# Patient Record
Sex: Female | Born: 1950 | Race: White | Hispanic: No | Smoking: Never smoker
Health system: Southern US, Community
[De-identification: ages and names within clinical notes are randomized; demographics above are authoritative.]

## PROBLEM LIST (undated history)

## (undated) DIAGNOSIS — F32A Depression, unspecified: Secondary | ICD-10-CM

## (undated) DIAGNOSIS — F329 Major depressive disorder, single episode, unspecified: Secondary | ICD-10-CM

## (undated) DIAGNOSIS — Z9889 Other specified postprocedural states: Secondary | ICD-10-CM

## (undated) DIAGNOSIS — I10 Essential (primary) hypertension: Secondary | ICD-10-CM

## (undated) DIAGNOSIS — D171 Benign lipomatous neoplasm of skin and subcutaneous tissue of trunk: Secondary | ICD-10-CM

## (undated) DIAGNOSIS — F419 Anxiety disorder, unspecified: Secondary | ICD-10-CM

## (undated) DIAGNOSIS — R112 Nausea with vomiting, unspecified: Secondary | ICD-10-CM

## (undated) DIAGNOSIS — I4891 Unspecified atrial fibrillation: Secondary | ICD-10-CM

## (undated) DIAGNOSIS — Z8709 Personal history of other diseases of the respiratory system: Secondary | ICD-10-CM

## (undated) DIAGNOSIS — N393 Stress incontinence (female) (male): Secondary | ICD-10-CM

## (undated) DIAGNOSIS — J189 Pneumonia, unspecified organism: Secondary | ICD-10-CM

## (undated) DIAGNOSIS — K219 Gastro-esophageal reflux disease without esophagitis: Secondary | ICD-10-CM

## (undated) DIAGNOSIS — M1990 Unspecified osteoarthritis, unspecified site: Secondary | ICD-10-CM

## (undated) HISTORY — DX: Essential (primary) hypertension: I10

## (undated) HISTORY — PX: APPENDECTOMY: SHX54

## (undated) HISTORY — DX: Pneumonia, unspecified organism: J18.9

## (undated) HISTORY — DX: Unspecified atrial fibrillation: I48.91

## (undated) HISTORY — PX: TONSILLECTOMY: SUR1361

## (undated) HISTORY — DX: Gastro-esophageal reflux disease without esophagitis: K21.9

---

## 1998-07-07 ENCOUNTER — Other Ambulatory Visit: Admission: RE | Admit: 1998-07-07 | Discharge: 1998-07-07 | Payer: Self-pay | Admitting: Obstetrics and Gynecology

## 2000-08-08 ENCOUNTER — Other Ambulatory Visit: Admission: RE | Admit: 2000-08-08 | Discharge: 2000-08-08 | Payer: Self-pay | Admitting: Obstetrics and Gynecology

## 2002-07-26 ENCOUNTER — Other Ambulatory Visit: Admission: RE | Admit: 2002-07-26 | Discharge: 2002-07-26 | Payer: Self-pay | Admitting: Obstetrics and Gynecology

## 2002-12-24 ENCOUNTER — Encounter: Admission: RE | Admit: 2002-12-24 | Discharge: 2002-12-24 | Payer: Self-pay | Admitting: Family Medicine

## 2002-12-24 ENCOUNTER — Encounter: Payer: Self-pay | Admitting: Family Medicine

## 2003-03-02 ENCOUNTER — Ambulatory Visit (HOSPITAL_COMMUNITY): Admission: RE | Admit: 2003-03-02 | Discharge: 2003-03-02 | Payer: Self-pay | Admitting: Family Medicine

## 2003-03-02 ENCOUNTER — Inpatient Hospital Stay (HOSPITAL_COMMUNITY): Admission: EM | Admit: 2003-03-02 | Discharge: 2003-03-09 | Payer: Self-pay | Admitting: Emergency Medicine

## 2003-10-22 ENCOUNTER — Other Ambulatory Visit: Admission: RE | Admit: 2003-10-22 | Discharge: 2003-10-22 | Payer: Self-pay | Admitting: Obstetrics and Gynecology

## 2005-05-25 ENCOUNTER — Ambulatory Visit: Payer: Self-pay | Admitting: Internal Medicine

## 2005-06-05 ENCOUNTER — Ambulatory Visit: Payer: Self-pay | Admitting: Internal Medicine

## 2005-06-21 ENCOUNTER — Ambulatory Visit: Payer: Self-pay | Admitting: Internal Medicine

## 2005-09-07 ENCOUNTER — Ambulatory Visit: Payer: Self-pay | Admitting: Internal Medicine

## 2005-10-07 ENCOUNTER — Ambulatory Visit: Payer: Self-pay | Admitting: Internal Medicine

## 2005-11-03 ENCOUNTER — Ambulatory Visit (HOSPITAL_COMMUNITY): Admission: RE | Admit: 2005-11-03 | Discharge: 2005-11-03 | Payer: Self-pay | Admitting: Chiropractic Medicine

## 2006-08-07 ENCOUNTER — Encounter: Admission: RE | Admit: 2006-08-07 | Discharge: 2006-08-07 | Payer: Self-pay | Admitting: Orthopaedic Surgery

## 2007-04-13 HISTORY — PX: KNEE ARTHROPLASTY: SHX992

## 2008-11-10 LAB — HM COLONOSCOPY: HM Colonoscopy: 2

## 2012-09-28 ENCOUNTER — Ambulatory Visit (INDEPENDENT_AMBULATORY_CARE_PROVIDER_SITE_OTHER): Payer: BC Managed Care – PPO | Admitting: Surgery

## 2012-10-10 DIAGNOSIS — D171 Benign lipomatous neoplasm of skin and subcutaneous tissue of trunk: Secondary | ICD-10-CM

## 2012-10-10 HISTORY — DX: Benign lipomatous neoplasm of skin and subcutaneous tissue of trunk: D17.1

## 2012-10-20 ENCOUNTER — Ambulatory Visit (INDEPENDENT_AMBULATORY_CARE_PROVIDER_SITE_OTHER): Payer: BC Managed Care – PPO | Admitting: Surgery

## 2012-10-20 ENCOUNTER — Encounter (INDEPENDENT_AMBULATORY_CARE_PROVIDER_SITE_OTHER): Payer: Self-pay | Admitting: Surgery

## 2012-10-20 VITALS — BP 136/84 | HR 80 | Temp 98.1°F | Resp 18 | Ht 62.0 in | Wt 221.8 lb

## 2012-10-20 DIAGNOSIS — D171 Benign lipomatous neoplasm of skin and subcutaneous tissue of trunk: Secondary | ICD-10-CM | POA: Insufficient documentation

## 2012-10-20 DIAGNOSIS — D1779 Benign lipomatous neoplasm of other sites: Secondary | ICD-10-CM

## 2012-10-20 NOTE — Patient Instructions (Addendum)

## 2012-10-20 NOTE — Progress Notes (Signed)
Chief Complaint:  Mass on the right back posteriorally  History of Present Illness:  Stacey Caldwell is an 62 y.o. female has an itching place her back for which see was seen at Dr. Nino Parsley office and found to have a mass over her scapula on the right.      Past Medical History  Diagnosis Date  . Arthritis   . GERD (gastroesophageal reflux disease)   . Hypertension   . Osteoporosis     Past Surgical History  Procedure Laterality Date  . Appendectomy    . Tonsillectomy and adenoidectomy    . Knee arthroplasty      Current Outpatient Prescriptions  Medication Sig Dispense Refill  . ALPRAZolam (XANAX) 0.5 MG tablet       . buPROPion (WELLBUTRIN XL) 150 MG 24 hr tablet       . omeprazole (PRILOSEC) 40 MG capsule       . valsartan-hydrochlorothiazide (DIOVAN-HCT) 320-12.5 MG per tablet       . venlafaxine XR (EFFEXOR-XR) 150 MG 24 hr capsule        No current facility-administered medications for this visit.   Codeine Family History  Problem Relation Age of Onset  . Cancer Mother     lung  . Cancer Father     lung   Social History:   reports that she has never smoked. She does not have any smokeless tobacco history on file. She reports that  drinks alcohol. She reports that she does not use illicit drugs.   REVIEW OF SYSTEMS - PERTINENT POSITIVES ONLY: noncontributory  Physical Exam:   Blood pressure 136/84, pulse 80, temperature 98.1 F (36.7 C), temperature source Temporal, resp. rate 18, height 5\' 2"  (1.575 m), weight 221 lb 12.8 oz (100.608 kg). Body mass index is 40.56 kg/(m^2).  Gen:  WDWN WF NAD  Neurological: Alert and oriented to person, place, and time. Motor and sensory function is grossly intact  Head: Normocephalic and atraumatic.  Eyes: Conjunctivae are normal. Pupils are equal, round, and reactive to light. No scleral icterus.  Neck: Normal range of motion. Neck supple. No tracheal deviation or thyromegaly present.   Skin: Skin is warm and dry. No  rash noted. No diaphoresis. No erythema. No pallor. Pscyh: Normal mood and affect. Behavior is normal. Judgment and thought content normal.   LABORATORY RESULTS: No results found for this or any previous visit (from the past 48 hour(s)).  RADIOLOGY RESULTS: No results found.  Problem List: There are no active problems to display for this patient.   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 Daphine Deutscher, MD, [facility] [phone] beeper [phone]  10/20/2012 2:19 PM

## 2012-11-06 ENCOUNTER — Encounter (HOSPITAL_BASED_OUTPATIENT_CLINIC_OR_DEPARTMENT_OTHER): Payer: Self-pay | Admitting: *Deleted

## 2012-11-06 NOTE — Pre-Procedure Instructions (Signed)
To come for BMET and EKG; no EKG at Dr. Tawana Scale office.

## 2012-11-10 ENCOUNTER — Other Ambulatory Visit: Payer: Self-pay

## 2012-11-10 ENCOUNTER — Encounter (HOSPITAL_BASED_OUTPATIENT_CLINIC_OR_DEPARTMENT_OTHER)
Admission: RE | Admit: 2012-11-10 | Discharge: 2012-11-10 | Disposition: A | Discharge status: Home / Self Care | Payer: BC Managed Care – PPO | Source: Ambulatory Visit | Attending: Surgery | Admitting: Surgery

## 2012-11-10 LAB — BASIC METABOLIC PANEL
BUN: 14 mg/dL (ref 6–23)
CO2: 27 mEq/L (ref 19–32)
Calcium: 9.1 mg/dL (ref 8.4–10.5)
Chloride: 100 mEq/L (ref 96–112)
Creatinine, Ser: 0.84 mg/dL (ref 0.50–1.10)
GFR calc Af Amer: 84 mL/min — ABNORMAL LOW (ref >90)
GFR calc non Af Amer: 72 mL/min — ABNORMAL LOW (ref >90)
Glucose, Bld: 113 mg/dL — ABNORMAL HIGH (ref 70–99)
Potassium: 4 mEq/L (ref 3.5–5.1)
Sodium: 137 mEq/L (ref 135–145)

## 2012-11-13 ENCOUNTER — Ambulatory Visit (HOSPITAL_BASED_OUTPATIENT_CLINIC_OR_DEPARTMENT_OTHER)
Admission: RE | Admit: 2012-11-13 | Discharge: 2012-11-13 | Disposition: A | Discharge status: Home / Self Care | Payer: BC Managed Care – PPO | Source: Ambulatory Visit | Attending: Surgery | Admitting: Surgery

## 2012-11-13 ENCOUNTER — Encounter (HOSPITAL_BASED_OUTPATIENT_CLINIC_OR_DEPARTMENT_OTHER)
Admission: RE | Disposition: A | Discharge status: Home / Self Care | Payer: Self-pay | Source: Ambulatory Visit | Attending: Surgery

## 2012-11-13 ENCOUNTER — Encounter (HOSPITAL_BASED_OUTPATIENT_CLINIC_OR_DEPARTMENT_OTHER): Payer: Self-pay | Admitting: Anesthesiology

## 2012-11-13 ENCOUNTER — Ambulatory Visit (HOSPITAL_BASED_OUTPATIENT_CLINIC_OR_DEPARTMENT_OTHER): Payer: BC Managed Care – PPO | Admitting: Anesthesiology

## 2012-11-13 DIAGNOSIS — M810 Age-related osteoporosis without current pathological fracture: Secondary | ICD-10-CM | POA: Insufficient documentation

## 2012-11-13 DIAGNOSIS — D1739 Benign lipomatous neoplasm of skin and subcutaneous tissue of other sites: Secondary | ICD-10-CM | POA: Insufficient documentation

## 2012-11-13 DIAGNOSIS — K219 Gastro-esophageal reflux disease without esophagitis: Secondary | ICD-10-CM | POA: Insufficient documentation

## 2012-11-13 DIAGNOSIS — Z79899 Other long term (current) drug therapy: Secondary | ICD-10-CM | POA: Insufficient documentation

## 2012-11-13 DIAGNOSIS — M129 Arthropathy, unspecified: Secondary | ICD-10-CM | POA: Insufficient documentation

## 2012-11-13 DIAGNOSIS — I10 Essential (primary) hypertension: Secondary | ICD-10-CM | POA: Insufficient documentation

## 2012-11-13 HISTORY — DX: Depression, unspecified: F32.A

## 2012-11-13 HISTORY — PX: LIPOMA EXCISION: SHX5283

## 2012-11-13 HISTORY — DX: Other specified postprocedural states: Z98.890

## 2012-11-13 HISTORY — DX: Benign lipomatous neoplasm of skin and subcutaneous tissue of trunk: D17.1

## 2012-11-13 HISTORY — DX: Anxiety disorder, unspecified: F41.9

## 2012-11-13 HISTORY — DX: Unspecified osteoarthritis, unspecified site: M19.90

## 2012-11-13 HISTORY — DX: Stress incontinence (female) (male): N39.3

## 2012-11-13 HISTORY — DX: Major depressive disorder, single episode, unspecified: F32.9

## 2012-11-13 HISTORY — DX: Personal history of other diseases of the respiratory system: Z87.09

## 2012-11-13 HISTORY — DX: Nausea with vomiting, unspecified: R11.2

## 2012-11-13 LAB — POCT HEMOGLOBIN-HEMACUE: Hemoglobin: 13.8 g/dL (ref 12.0–15.0)

## 2012-11-13 SURGERY — EXCISION LIPOMA
Anesthesia: Monitor Anesthesia Care | Site: Back | Laterality: Right | Wound class: Clean

## 2012-11-13 MED ORDER — MIDAZOLAM HCL 5 MG/5ML IJ SOLN
INTRAMUSCULAR | Status: DC | PRN
  Administered 2012-11-13 (×2): 1 mg via INTRAVENOUS

## 2012-11-13 MED ORDER — ONDANSETRON HCL 4 MG/2ML IJ SOLN: 4.0000 mg | Freq: Once | INTRAMUSCULAR | Status: DC | PRN

## 2012-11-13 MED ORDER — FENTANYL CITRATE 0.05 MG/ML IJ SOLN
INTRAMUSCULAR | Status: DC | PRN
  Administered 2012-11-13: 25 ug via INTRAVENOUS
  Administered 2012-11-13: 50 ug via INTRAVENOUS

## 2012-11-13 MED ORDER — ONDANSETRON HCL 4 MG/2ML IJ SOLN
INTRAMUSCULAR | Status: DC | PRN
  Administered 2012-11-13: 4 mg via INTRAVENOUS

## 2012-11-13 MED ORDER — HYDROMORPHONE HCL PF 1 MG/ML IJ SOLN: 0.2500 mg | INTRAMUSCULAR | Status: DC | PRN

## 2012-11-13 MED ORDER — LIDOCAINE HCL (CARDIAC) 20 MG/ML IV SOLN
INTRAVENOUS | Status: DC | PRN
  Administered 2012-11-13: 50 mg via INTRAVENOUS

## 2012-11-13 MED ORDER — LACTATED RINGERS IV SOLN
INTRAVENOUS | Status: DC
  Administered 2012-11-13: 14:00:00 via INTRAVENOUS

## 2012-11-13 MED ORDER — LIDOCAINE HCL (PF) 1 % IJ SOLN
INTRAMUSCULAR | Status: DC | PRN
  Administered 2012-11-13: 6 mL

## 2012-11-13 MED ORDER — PROPOFOL INFUSION 10 MG/ML OPTIME
INTRAVENOUS | Status: DC | PRN
  Administered 2012-11-13: 100 ug/kg/min via INTRAVENOUS

## 2012-11-13 MED ORDER — CHLORHEXIDINE GLUCONATE 4 % EX LIQD: 1.0000 "application " | Freq: Once | CUTANEOUS | Status: DC

## 2012-11-13 MED ORDER — HEPARIN SODIUM (PORCINE) 5000 UNIT/ML IJ SOLN
5000.0000 [IU] | Freq: Once | INTRAMUSCULAR | Status: AC
  Administered 2012-11-13: 5000 [IU] via SUBCUTANEOUS

## 2012-11-13 MED ORDER — SCOPOLAMINE 1 MG/3DAYS TD PT72
1.0000 | MEDICATED_PATCH | TRANSDERMAL | Status: DC
  Administered 2012-11-13: 1.5 mg via TRANSDERMAL

## 2012-11-13 MED ORDER — OXYCODONE HCL 5 MG/5ML PO SOLN: 5.0000 mg | Freq: Once | ORAL | Status: DC | PRN

## 2012-11-13 MED ORDER — OXYCODONE-ACETAMINOPHEN 5-325 MG PO TABS: 1.0000 | ORAL_TABLET | ORAL | Status: DC | PRN

## 2012-11-13 MED ORDER — OXYCODONE HCL 5 MG PO TABS: 5.0000 mg | ORAL_TABLET | Freq: Once | ORAL | Status: DC | PRN

## 2012-11-13 MED ORDER — CEFAZOLIN SODIUM-DEXTROSE 2-3 GM-% IV SOLR
2.0000 g | INTRAVENOUS | Status: AC
  Administered 2012-11-13: 2 g via INTRAVENOUS

## 2012-11-13 MED ORDER — FENTANYL CITRATE 0.05 MG/ML IJ SOLN: 50.0000 ug | INTRAMUSCULAR | Status: DC | PRN

## 2012-11-13 MED ORDER — MIDAZOLAM HCL 2 MG/ML PO SYRP: 12.0000 mg | ORAL_SOLUTION | Freq: Once | ORAL | Status: DC | PRN

## 2012-11-13 MED ORDER — MIDAZOLAM HCL 2 MG/2ML IJ SOLN: 1.0000 mg | INTRAMUSCULAR | Status: DC | PRN

## 2012-11-13 MED ORDER — DEXAMETHASONE SODIUM PHOSPHATE 4 MG/ML IJ SOLN
INTRAMUSCULAR | Status: DC | PRN
  Administered 2012-11-13: 10 mg via INTRAVENOUS

## 2012-11-13 SURGICAL SUPPLY — 47 items
ADH SKN CLS APL DERMABOND .7 (GAUZE/BANDAGES/DRESSINGS)
APL SKNCLS STERI-STRIP NONHPOA (GAUZE/BANDAGES/DRESSINGS)
BENZOIN TINCTURE PRP APPL 2/3 (GAUZE/BANDAGES/DRESSINGS) IMPLANT
BLADE SURG 15 STRL LF DISP TIS (BLADE) ×1 IMPLANT
BLADE SURG 15 STRL SS (BLADE) ×2
BLADE SURG ROTATE 9660 (MISCELLANEOUS) IMPLANT
CANISTER SUCTION 1200CC (MISCELLANEOUS) ×1 IMPLANT
CLEANER CAUTERY TIP 5X5 PAD (MISCELLANEOUS) ×1 IMPLANT
CLOTH BEACON ORANGE TIMEOUT ST (SAFETY) ×2 IMPLANT
COVER MAYO STAND STRL (DRAPES) ×2 IMPLANT
COVER TABLE BACK 60X90 (DRAPES) ×2 IMPLANT
DECANTER SPIKE VIAL GLASS SM (MISCELLANEOUS) ×2 IMPLANT
DERMABOND ADVANCED (GAUZE/BANDAGES/DRESSINGS)
DERMABOND ADVANCED .7 DNX12 (GAUZE/BANDAGES/DRESSINGS) IMPLANT
DRAPE PED LAPAROTOMY (DRAPES) ×1 IMPLANT
DRAPE U-SHAPE 76X120 STRL (DRAPES) IMPLANT
ELECT REM PT RETURN 9FT ADLT (ELECTROSURGICAL) ×2
ELECTRODE REM PT RTRN 9FT ADLT (ELECTROSURGICAL) ×1 IMPLANT
GAUZE SPONGE 4X4 12PLY STRL LF (GAUZE/BANDAGES/DRESSINGS) ×2 IMPLANT
GLOVE BIO SURGEON STRL SZ8 (GLOVE) ×2 IMPLANT
GLOVE BIOGEL M STRL SZ7.5 (GLOVE) ×1 IMPLANT
GLOVE BIOGEL PI IND STRL 8 (GLOVE) IMPLANT
GLOVE BIOGEL PI INDICATOR 8 (GLOVE) ×1
GLOVE EXAM NITRILE MD LF STRL (GLOVE) ×2 IMPLANT
GOWN PREVENTION PLUS XLARGE (GOWN DISPOSABLE) ×1 IMPLANT
GOWN PREVENTION PLUS XXLARGE (GOWN DISPOSABLE) ×3 IMPLANT
NDL HYPO 25X1 1.5 SAFETY (NEEDLE) ×1 IMPLANT
NEEDLE 27GAX1X1/2 (NEEDLE) IMPLANT
NEEDLE HYPO 25X1 1.5 SAFETY (NEEDLE) ×2 IMPLANT
NS IRRIG 1000ML POUR BTL (IV SOLUTION) IMPLANT
PACK BASIN DAY SURGERY FS (CUSTOM PROCEDURE TRAY) ×2 IMPLANT
PAD CLEANER CAUTERY TIP 5X5 (MISCELLANEOUS) ×1
PENCIL BUTTON HOLSTER BLD 10FT (ELECTRODE) ×2 IMPLANT
STRIP CLOSURE SKIN 1/2X4 (GAUZE/BANDAGES/DRESSINGS) IMPLANT
SUT ETHILON 3 0 FSL (SUTURE) IMPLANT
SUT ETHILON 5 0 PS 2 18 (SUTURE) IMPLANT
SUT PROLENE 4 0 PS 2 18 (SUTURE) ×1 IMPLANT
SUT VIC AB 4-0 SH 18 (SUTURE) ×2 IMPLANT
SUT VIC AB 5-0 PS2 18 (SUTURE) IMPLANT
SUT VICRYL 3-0 CR8 SH (SUTURE) IMPLANT
SYR BULB 3OZ (MISCELLANEOUS) ×2 IMPLANT
SYR CONTROL 10ML LL (SYRINGE) ×2 IMPLANT
TOWEL OR 17X24 6PK STRL BLUE (TOWEL DISPOSABLE) ×2 IMPLANT
TRAY DSU PREP LF (CUSTOM PROCEDURE TRAY) ×2 IMPLANT
TUBE CONNECTING 20X1/4 (TUBING) ×1 IMPLANT
UNDERPAD 30X30 INCONTINENT (UNDERPADS AND DIAPERS) IMPLANT
YANKAUER SUCT BULB TIP NO VENT (SUCTIONS) ×2 IMPLANT

## 2012-11-13 NOTE — Anesthesia Preprocedure Evaluation (Signed)
Anesthesia Evaluation  Patient identified by MRN, date of birth, ID band Patient awake    Reviewed: Allergy & Precautions, H&P , NPO status , Patient's Chart, lab work & pertinent test results  History of Anesthesia Complications (+) PONV  Airway Mallampati: I TM Distance: >3 FB Neck ROM: Full    Dental  (+) Teeth Intact and Dental Advisory Given   Pulmonary  breath sounds clear to auscultation        Cardiovascular hypertension, Pt. on medications Rhythm:Regular Rate:Normal     Neuro/Psych    GI/Hepatic   Endo/Other    Renal/GU      Musculoskeletal   Abdominal   Peds  Hematology   Anesthesia Other Findings   Reproductive/Obstetrics                           Anesthesia Physical Anesthesia Plan  ASA: II  Anesthesia Plan: MAC   Post-op Pain Management:    Induction: Intravenous  Airway Management Planned: Simple Face Mask  Additional Equipment:   Intra-op Plan:   Post-operative Plan:   Informed Consent: I have reviewed the patients History and Physical, chart, labs and discussed the procedure including the risks, benefits and alternatives for the proposed anesthesia with the patient or authorized representative who has indicated his/her understanding and acceptance.   Dental advisory given  Plan Discussed with: CRNA, Anesthesiologist and Surgeon  Anesthesia Plan Comments:         Anesthesia Quick Evaluation

## 2012-11-13 NOTE — Transfer of Care (Signed)
Immediate Anesthesia Transfer of Care Note  Patient: Stacey Caldwell  Procedure(s) Performed: Procedure(s): EXCISION LIPOMA (Right)  Patient Location: PACU  Anesthesia Type:MAC  Level of Consciousness: awake and alert   Airway & Oxygen Therapy: Patient Spontanous Breathing and Patient connected to face mask oxygen  Post-op Assessment: Report given to PACU RN and Post -op Vital signs reviewed and stable  Post vital signs: Reviewed and stable  Complications: No apparent anesthesia complications

## 2012-11-13 NOTE — Interval H&P Note (Signed)
History and Physical Interval Note:  11/13/2012 3:03 PM  Stacey Caldwell  has presented today for surgery, with the diagnosis of lipoma back  The various methods of treatment have been discussed with the patient and family. After consideration of risks, benefits and other options for treatment, the patient has consented to  Procedure(s): EXCISION LIPOMA (N/A) as a surgical intervention .  The patient's history has been reviewed, patient examined, no change in status, stable for surgery.  I have reviewed the patient's chart and labs.  Questions were answered to the patient's satisfaction.     Shakeeta Godette B

## 2012-11-13 NOTE — Anesthesia Postprocedure Evaluation (Signed)
  Anesthesia Post-op Note  Patient: Stacey Caldwell  Procedure(s) Performed: Procedure(s): EXCISION LIPOMA (Right)  Patient Location: PACU  Anesthesia Type:MAC  Level of Consciousness: awake, alert  and oriented  Airway and Oxygen Therapy: Patient Spontanous Breathing  Post-op Pain: none  Post-op Assessment: Post-op Vital signs reviewed  Post-op Vital Signs: Reviewed  Complications: No apparent anesthesia complications

## 2012-11-13 NOTE — Progress Notes (Signed)
Dr.Crews reviewed EKG - OK for surgery. 

## 2012-11-13 NOTE — H&P (View-Only) (Signed)
Chief Complaint:  Mass on the right back posteriorally  History of Present Illness:  Stacey Caldwell is an 61 y.o. female has an itching place her back for which see was seen at Dr. Gruber's office and found to have a mass over her scapula on the right.      Past Medical History  Diagnosis Date  . Arthritis   . GERD (gastroesophageal reflux disease)   . Hypertension   . Osteoporosis     Past Surgical History  Procedure Laterality Date  . Appendectomy    . Tonsillectomy and adenoidectomy    . Knee arthroplasty      Current Outpatient Prescriptions  Medication Sig Dispense Refill  . ALPRAZolam (XANAX) 0.5 MG tablet       . buPROPion (WELLBUTRIN XL) 150 MG 24 hr tablet       . omeprazole (PRILOSEC) 40 MG capsule       . valsartan-hydrochlorothiazide (DIOVAN-HCT) 320-12.5 MG per tablet       . venlafaxine XR (EFFEXOR-XR) 150 MG 24 hr capsule        No current facility-administered medications for this visit.   Codeine Family History  Problem Relation Age of Onset  . Cancer Mother     lung  . Cancer Father     lung   Social History:   reports that she has never smoked. She does not have any smokeless tobacco history on file. She reports that  drinks alcohol. She reports that she does not use illicit drugs.   REVIEW OF SYSTEMS - PERTINENT POSITIVES ONLY: noncontributory  Physical Exam:   Blood pressure 136/84, pulse 80, temperature 98.1 F (36.7 C), temperature source Temporal, resp. rate 18, height 5' 2" (1.575 m), weight 221 lb 12.8 oz (100.608 kg). Body mass index is 40.56 kg/(m^2).  Gen:  WDWN WF NAD  Neurological: Alert and oriented to person, place, and time. Motor and sensory function is grossly intact  Head: Normocephalic and atraumatic.  Eyes: Conjunctivae are normal. Pupils are equal, round, and reactive to light. No scleral icterus.  Neck: Normal range of motion. Neck supple. No tracheal deviation or thyromegaly present.   Skin: Skin is warm and dry. No  rash noted. No diaphoresis. No erythema. No pallor. Pscyh: Normal mood and affect. Behavior is normal. Judgment and thought content normal.   LABORATORY RESULTS: No results found for this or any previous visit (from the past 48 hour(s)).  RADIOLOGY RESULTS: No results found.  Problem List: There are no active problems to display for this patient.   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 Senaya Dicenso, MD, FACS  [facility] [phone] beeper [phone]  10/20/2012 2:19 PM     

## 2012-11-13 NOTE — Op Note (Signed)
Surgeon: Wenda Low, MD, FACS  Asst:  none  Anes:  MAC with 1% lidocaine  Procedure: Excision of 2 cm lipoma of the right back  Diagnosis: Probable lipoma; path pending   Complications: none  EBL:    minimal  cc  Description of Procedure:  Taken to OR 8 at [facility].  Lateral position with MAC.  Timeout performed.  Previously marked mass was infiltrated with 1% lidocaine and incision made.  The mass resided atop the muscle fascia and was excised in toto about 2 cm in diameter.  Hemostasis present.  Closed with 4-0 vicryl and 4-0 prolene.    Matt B. Daphine Deutscher, MD, [facility], [place] [phone]

## 2012-11-15 ENCOUNTER — Encounter (HOSPITAL_BASED_OUTPATIENT_CLINIC_OR_DEPARTMENT_OTHER): Payer: Self-pay | Admitting: Surgery

## 2012-11-22 ENCOUNTER — Ambulatory Visit (INDEPENDENT_AMBULATORY_CARE_PROVIDER_SITE_OTHER): Payer: BC Managed Care – PPO

## 2012-11-22 ENCOUNTER — Encounter (INDEPENDENT_AMBULATORY_CARE_PROVIDER_SITE_OTHER): Payer: Self-pay

## 2012-11-22 VITALS — BP 138/78 | HR 72 | Temp 97.6°F | Resp 16 | Ht 62.0 in | Wt 221.8 lb

## 2012-11-22 DIAGNOSIS — Z4802 Encounter for removal of sutures: Secondary | ICD-10-CM

## 2012-11-22 NOTE — Progress Notes (Signed)
Patient comes into office today for nurse visit for suture removal from lipoma excision on back (11/13/12).  Patient incision appears to be healing well without redness, drainage or swelling.  I removed sutures, incision intact, then placed steri-strips over intact incision.  Patient tolerated well.  Patient has follow up appointment for 11/30/12 @ 8:45 am w/Dr. Daphine Deutscher.

## 2012-11-30 ENCOUNTER — Encounter (INDEPENDENT_AMBULATORY_CARE_PROVIDER_SITE_OTHER): Payer: Self-pay | Admitting: Surgery

## 2012-11-30 ENCOUNTER — Ambulatory Visit (INDEPENDENT_AMBULATORY_CARE_PROVIDER_SITE_OTHER): Payer: BC Managed Care – PPO | Admitting: Surgery

## 2012-11-30 VITALS — BP 140/78 | HR 76 | Temp 97.0°F | Resp 15 | Ht 62.0 in | Wt 223.0 lb

## 2012-11-30 DIAGNOSIS — D1779 Benign lipomatous neoplasm of other sites: Secondary | ICD-10-CM

## 2012-11-30 DIAGNOSIS — D171 Benign lipomatous neoplasm of skin and subcutaneous tissue of trunk: Secondary | ICD-10-CM

## 2012-11-30 NOTE — Progress Notes (Signed)
Riley Kill 62 y.o.  Body mass index is 40.78 kg/(m^2).  Patient Active Problem List   Diagnosis Date Noted  . Lipoma of back 10/20/2012    Allergies  Allergen Reactions  . Codeine Other (See Comments)    HEADACHE    Past Surgical History  Procedure Laterality Date  . Appendectomy    . Knee arthroplasty Right 2009  . Tonsillectomy      age 56  . Lipoma excision Right 11/13/2012    Procedure: EXCISION LIPOMA;  Surgeon: Valarie Merino, MD;  Location: [facility];  Service: General;  Laterality: Right;   Pamelia Hoit, MD No diagnosis found.  Lipoma path was benign.  Incision looks good.  Return prn.   Matt B. Daphine Deutscher, MD, [facility] [phone] beeper [phone]  11/30/2012 9:31 AM

## 2012-12-08 ENCOUNTER — Encounter (INDEPENDENT_AMBULATORY_CARE_PROVIDER_SITE_OTHER): Payer: BC Managed Care – PPO | Admitting: Surgery

## 2013-01-09 ENCOUNTER — Encounter (INDEPENDENT_AMBULATORY_CARE_PROVIDER_SITE_OTHER): Payer: Self-pay

## 2013-04-09 ENCOUNTER — Encounter: Payer: Self-pay | Admitting: Nurse Practitioner

## 2013-04-09 ENCOUNTER — Ambulatory Visit (INDEPENDENT_AMBULATORY_CARE_PROVIDER_SITE_OTHER): Payer: BC Managed Care – PPO | Admitting: Nurse Practitioner

## 2013-04-09 ENCOUNTER — Other Ambulatory Visit: Payer: Self-pay

## 2013-04-09 VITALS — BP 108/74 | HR 64 | Resp 16 | Ht 61.0 in | Wt 218.0 lb

## 2013-04-09 DIAGNOSIS — Z01419 Encounter for gynecological examination (general) (routine) without abnormal findings: Secondary | ICD-10-CM

## 2013-04-09 DIAGNOSIS — Z0000 Encounter for general adult medical examination without abnormal findings: Secondary | ICD-10-CM

## 2013-04-09 NOTE — Patient Instructions (Signed)

## 2013-04-09 NOTE — Progress Notes (Signed)
Patient ID: Stacey Caldwell, female   DOB: 29-Jan-1951, 62 y.o.   MRN: [id number] 62 y.o. G0P0 Divorced Caucasian Fe here for annual exam.  Menopause at age 80.  Confirmed with lab test done by Dr. Precious Bard.  Not SA since 1987.  No symptoms of vaginal dryness.  The only problem today is with stress incontinence. Wears 1 pad daily.   Now retired X 3 years.  Still part time work at [employer] @ 9 hours a week.  No LMP recorded. Patient is postmenopausal.          Sexually active: no  The current method of family planning is none.    Exercising: yes  Home exercise routine includes back exercises on mat. Smoker:  no  Health Maintenance: Pap:  About 6 years ago MMG:  About 6 years ago - Thermogram scheduled for 04/10/13 Colonoscopy:  2010, Dr. Loreta Ave, hyperplastic polyp, repeat in 10 years BMD:   never TDaP:  2010 Labs: PCP - Dr. Benedetto Goad   reports that she has never smoked. She has never used smokeless tobacco. She reports that she drinks about 1.2 ounces of alcohol per week. She reports that she does not use illicit drugs.  Past Medical History  Diagnosis Date  . GERD (gastroesophageal reflux disease)   . PONV (postoperative nausea and vomiting)   . Stress incontinence   . Osteoarthritis   . Depression   . Anxiety   . Hypertension     under control with med., has been on med. x 3-4 yr.  . History of asthma     no meds. in 2 yr.  . Lipoma of back 10/2012    Past Surgical History  Procedure Laterality Date  . Appendectomy    . Knee arthroplasty Right 2009  . Tonsillectomy      age 2  . Lipoma excision Right 11/13/2012    Procedure: EXCISION LIPOMA;  Surgeon: Valarie Merino, MD;  Location: [facility];  Service: General;  Laterality: Right;    Current Outpatient Prescriptions  Medication Sig Dispense Refill  . ALPRAZolam (XANAX) 0.5 MG tablet Take 0.5 mg by mouth 2 (two) times daily as needed.       Marland Kitchen buPROPion (WELLBUTRIN XL) 150 MG 24 hr tablet Take 150 mg by mouth every  morning.       . Melatonin 5 MG TABS Take 5 mg by mouth every evening.      Marland Kitchen omeprazole (PRILOSEC) 40 MG capsule Take 40 mg by mouth 2 (two) times daily.       . valsartan-hydrochlorothiazide (DIOVAN-HCT) 320-12.5 MG per tablet Take 1 tablet by mouth daily.       Marland Kitchen venlafaxine XR (EFFEXOR-XR) 150 MG 24 hr capsule Take 150 mg by mouth daily.        No current facility-administered medications for this visit.    Family History  Problem Relation Age of Onset  . Cancer Mother     lung  . Cancer Father     lung    ROS:  Pertinent items are noted in HPI.  Otherwise, a comprehensive ROS was negative.  Exam:   BP 108/74  Pulse 64  Resp 16  Ht 5\' 1"  (1.549 m)  Wt 218 lb (98.884 kg)  BMI 41.21 kg/m2 Height: 5\' 1"  (154.9 cm)  Ht Readings from Last 3 Encounters:  04/09/13 5\' 1"  (1.549 m)  11/30/12 5\' 2"  (1.575 m)  11/22/12 5\' 2"  (1.575 m)    General appearance: alert,  cooperative and appears stated age Head: Normocephalic, without obvious abnormality, atraumatic Neck: no adenopathy, supple, symmetrical, trachea midline and thyroid normal to inspection and palpation Lungs: clear to auscultation bilaterally Breasts: normal appearance, no masses or tenderness Heart: regular rate and rhythm Abdomen: soft, non-tender; no masses,  no organomegaly Extremities: extremities normal, atraumatic, no cyanosis or edema Skin: Skin color, texture, turgor normal. No rashes or lesions Lymph nodes: Cervical, supraclavicular, and axillary nodes normal. No abnormal inguinal nodes palpated Neurologic: Grossly normal   Pelvic: External genitalia:  no lesions              Urethra:  normal appearing urethra with no masses, tenderness or lesions              Bartholin's and Skene's: normal                 Vagina: normal appearing vagina with normal color and discharge, no lesions              Cervix: anteverted              Pap taken: yes Bimanual Exam:  Uterus:  normal size, contour, position,  consistency, mobility, non-tender              Adnexa: no mass, fullness, tenderness               Rectovaginal: Confirms               Anus:  normal sphincter tone, no lesions  A:  Well Woman with normal exam  Postmenopausal  Lapse of health care here for 6 years - former pt. Of Dr. Precious Bard  History of stress incontinence  P:   Pap smear as per guidelines Pap done  Encouraged BMD - order is placed in Epic and faxed to [facility]  Encouraged to do mammogram vs.thermogram  She will return for further urological evaluation with Dr. Edward Jolly.  Counseled on breast self exam, mammography screening, adequate intake of calcium and vitamin D, diet and exercise, Kegel's exercises return annually or prn  An After Visit Summary was printed and given to the patient.

## 2013-04-10 NOTE — Progress Notes (Signed)
Encounter reviewed by Dr. Dorie Ohms Silva.  

## 2013-05-08 ENCOUNTER — Telehealth: Payer: Self-pay | Admitting: *Deleted

## 2013-05-08 NOTE — Telephone Encounter (Signed)
Pt notified of results.  Hardcopy sent to scan.

## 2013-05-08 NOTE — Telephone Encounter (Signed)
Message left to return call. RE: bone density results.  Hardcopy on my desk. (no paper chart)

## 2013-05-30 ENCOUNTER — Encounter: Payer: Self-pay | Admitting: Obstetrics and Gynecology

## 2013-05-30 ENCOUNTER — Ambulatory Visit (INDEPENDENT_AMBULATORY_CARE_PROVIDER_SITE_OTHER): Payer: BC Managed Care – PPO | Admitting: Obstetrics and Gynecology

## 2013-05-30 VITALS — BP 150/78 | HR 76 | Ht 61.0 in | Wt 223.5 lb

## 2013-05-30 DIAGNOSIS — R32 Unspecified urinary incontinence: Secondary | ICD-10-CM

## 2013-05-30 NOTE — Progress Notes (Signed)
Patient ID: Stacey Caldwell, female   DOB: 17-Mar-1951, 63 y.o.   MRN: [id number] GYNECOLOGY PROBLEM VISIT  PCP:    Kathryne Eriksson, MD  Referring provider:   HPI: 63 y.o.   Divorced  Caucasian  female   G0P0 with No LMP recorded. Patient is postmenopausal.   here for  Evaluation of urinary incontinence. No evaluation or treatment yet.   Leakage of urine for 6 years.  Now wearing heavier pads. Leaks with laugh and cough.  Worse when has coughing and bronchitis, which can be seasonal.    Can leak without provocation when holds urine for a long time. Voids q 1 hour.  NF - once.  No enuresis.  Leans forward to void.  No dysuria or hematuria.  Has had several UTIs.  2 UTIs per year.  No history of kidney stones.   Life long constipation.  Uses herbs for regulation.   BMs twice a days. No leakage of stool.   Prefers a natural approach to incontinence care.   GYNECOLOGIC HISTORY: No LMP recorded. Patient is postmenopausal. Sexually active:  no Partner preference: female Contraception:   postmenopausal Menopausal hormone therapy: no DES exposure:   no Blood transfusions:   no Sexually transmitted diseases:   no GYN Procedures:  no Mammogram:    Patient had a thermography 03/2013 wnl             Pap:   04/09/13 wnl:neg HR HPV History of abnormal pap smear:  no   OB History   Grav Para Term Preterm Abortions TAB SAB Ect Mult Living   0 0                 Family History  Problem Relation Age of Onset  . Lung cancer Mother     and ETOH abuse  . Lung cancer Father     and ETOH abuse  . Throat cancer Brother     ETOH abuse    Patient Active Problem List   Diagnosis Date Noted  . Lipoma of back 10/20/2012    Past Medical History  Diagnosis Date  . GERD (gastroesophageal reflux disease)   . PONV (postoperative nausea and vomiting)   . Stress incontinence   . Osteoarthritis   . Depression   . Anxiety   . Hypertension     under control with med., has been on med.  x 3-4 yr.  . History of asthma     no meds. in 2 yr.  . Lipoma of back 10/2012    Past Surgical History  Procedure Laterality Date  . Appendectomy    . Knee arthroplasty Right 2009  . Tonsillectomy      age 59  . Lipoma excision Right 11/13/2012    Procedure: EXCISION LIPOMA;  Surgeon: Pedro Earls, MD;  Location: [facility];  Service: General;  Laterality: Right;    ALLERGIES: Codeine  Current Outpatient Prescriptions  Medication Sig Dispense Refill  . ALPRAZolam (XANAX) 0.5 MG tablet Take 0.5 mg by mouth 2 (two) times daily as needed.       Marland Kitchen buPROPion (WELLBUTRIN XL) 150 MG 24 hr tablet Take 150 mg by mouth every morning.       Marland Kitchen CALCIUM & MAGNESIUM CARBONATES PO Take 2 capsules by mouth daily.      . Melatonin 5 MG TABS Take 5 mg by mouth every evening.      Marland Kitchen omeprazole (PRILOSEC) 40 MG capsule Take 40 mg by mouth 2 (  two) times daily.       . valsartan-hydrochlorothiazide (DIOVAN-HCT) 320-12.5 MG per tablet Take 1 tablet by mouth daily.       Marland Kitchen venlafaxine XR (EFFEXOR-XR) 150 MG 24 hr capsule Take 150 mg by mouth daily.        No current facility-administered medications for this visit.     ROS:  Pertinent items are noted in HPI.  SOCIAL HISTORY:  Divorced.  Semi retired.  Works for [employer].   PHYSICAL EXAMINATION:    BP 150/78  Pulse 76  Ht 5\' 1"  (1.549 m)  Wt 223 lb 8 oz (101.379 kg)  BMI 42.25 kg/m2   Wt Readings from Last 3 Encounters:  05/30/13 223 lb 8 oz (101.379 kg)  04/09/13 218 lb (98.884 kg)  11/30/12 223 lb (101.152 kg)     Ht Readings from Last 3 Encounters:  05/30/13 5\' 1"  (1.549 m)  04/09/13 5\' 1"  (1.549 m)  11/30/12 5\' 2"  (1.575 m)    General appearance: alert, cooperative and appears stated age Head: Normocephalic, without obvious abnormality, atraumatic Neck: no adenopathy, supple, symmetrical, trachea midline and thyroid not enlarged, symmetric, no tenderness/mass/nodules Lungs: clear to auscultation bilaterally Heart:  regular rate and rhythm Abdomen: soft, non-tender; no masses,  no organomegaly Extremities: extremities normal, atraumatic, no cyanosis or edema Skin: Skin color, texture, turgor normal. No rashes or lesions Lymph nodes: Cervical, supraclavicular, and axillary nodes normal. No abnormal inguinal nodes palpated Neurologic: Grossly normal  Pelvic: External genitalia:  no lesions              Urethra:  normal appearing urethra with no masses, tenderness or lesions              Bartholins and Skenes: normal                 Vagina: normal appearing vagina with normal color and discharge, no lesions.  Good support              Cervix: normal appearance          Bimanual Exam:  Uterus:  uterus is normal size, shape, consistency and nontender.  Good support.                                      Adnexa: normal adnexa in size, nontender and no masses                                      Rectovaginal: Confirms                                      Anus:  normal sphincter tone, no lesions  ASSESSMENT  Genuine stress incontinence. Obesity. Seasonal bronchitis.   PLAN  Counseled on urinary incontinence - etiologies and treatment options.  I have provided reading materials from ACOG regarding urinary incontinence in general as well as medical and surgical treatment for these conditions.   We discussed benefits and risks which include but are not limited to bleeding, infection, damage to surrounding organs, mesh erosion and exposure, urinary retention, reoperation, reaction to anesthesia, pneumonia, DVT, PE, and death.   Patient wishes to proceed with physical therapy.  Referral made to [facility] - Ileana Roup.   I  encouraged good control of seasonal bronchitis and weight loss.  Return prn.    An After Visit Summary was printed and given to the patient.

## 2013-05-30 NOTE — Patient Instructions (Signed)
You will receive a call from [facility] for your incontinence physical therapy appointment.  Thank you!

## 2013-06-25 ENCOUNTER — Telehealth: Payer: Self-pay | Admitting: Obstetrics and Gynecology

## 2013-06-25 NOTE — Telephone Encounter (Signed)
Per fax received from Melanie with [facility], she has made several attempts to contact the patient to schedule an appoinment with NO success. I will ask Melanie to schedule the appointment and will contact the patient with the appointment details. °

## 2013-06-27 NOTE — Telephone Encounter (Signed)
Per fax received from [facility] with [facility], this patient is scheduled with Ileana Roup 04.01.2015 @ 1500. Patient aware

## 2014-04-12 DIAGNOSIS — J189 Pneumonia, unspecified organism: Secondary | ICD-10-CM

## 2014-04-12 HISTORY — DX: Pneumonia, unspecified organism: J18.9

## 2014-04-15 ENCOUNTER — Encounter: Payer: Self-pay | Admitting: Nurse Practitioner

## 2014-04-15 ENCOUNTER — Ambulatory Visit (INDEPENDENT_AMBULATORY_CARE_PROVIDER_SITE_OTHER): Payer: Medicare Other | Admitting: Nurse Practitioner

## 2014-04-15 VITALS — BP 104/62 | HR 70 | Resp 16 | Ht 60.75 in | Wt 222.0 lb

## 2014-04-15 DIAGNOSIS — Z01419 Encounter for gynecological examination (general) (routine) without abnormal findings: Secondary | ICD-10-CM

## 2014-04-15 DIAGNOSIS — Z1211 Encounter for screening for malignant neoplasm of colon: Secondary | ICD-10-CM

## 2014-04-15 NOTE — Progress Notes (Signed)
64 y.o. G0P0 Divorced Caucasian Fe here for annual exam.  Not SA since 1986. No new diagnosis.  Went to [employer] pelvic floor training with Ileana Roup and does not need surgery at this time. Has stress incontinence.  Does not plan to retire yet - will be working part time.  Patient's last menstrual period was 09/11/2002.          Sexually active: No.  The current method of family planning is post menopausal status.    Exercising: No. Smoker:  no  Health Maintenance: Pap:  04-09-13 neg HPV HR neg MMG:  Thermogram 04-10-13 Colonoscopy: 2010 f/u 10 yrs BMD:  04/25/13, LRadius -0.4/Spine 2.5/L hip -1.1/R hip -0.7 TDaP:  2010 Labs: none Self breast exam: done monthly   reports that she has never smoked. She has never used smokeless tobacco. She reports that she drinks about 1.2 oz of alcohol per week. She reports that she does not use illicit drugs.  Past Medical History  Diagnosis Date  . GERD (gastroesophageal reflux disease)   . PONV (postoperative nausea and vomiting)   . Stress incontinence   . Osteoarthritis   . Depression   . Anxiety   . Hypertension     under control with med., has been on med. x 3-4 yr.  . History of asthma     no meds. in 2 yr.  . Lipoma of back 10/2012    Past Surgical History  Procedure Laterality Date  . Appendectomy    . Knee arthroplasty Right 2009  . Tonsillectomy      age 55  . Lipoma excision Right 11/13/2012    Procedure: EXCISION LIPOMA;  Surgeon: Pedro Earls, MD;  Location: [place];  Service: General;  Laterality: Right;    Current Outpatient Prescriptions  Medication Sig Dispense Refill  . ALPRAZolam (XANAX) 0.5 MG tablet Take 0.5 mg by mouth 2 (two) times daily as needed.     . benzonatate (TESSALON) 200 MG capsule   4  . buPROPion (WELLBUTRIN XL) 150 MG 24 hr tablet Take 150 mg by mouth every morning.     Marland Kitchen CALCIUM & MAGNESIUM CARBONATES PO Take 2 capsules by mouth daily.    . Melatonin 5 MG TABS Take 5 mg by mouth  every evening.    Marland Kitchen omeprazole (PRILOSEC) 40 MG capsule Take 40 mg by mouth 2 (two) times daily.     . valsartan-hydrochlorothiazide (DIOVAN-HCT) 320-12.5 MG per tablet Take 1 tablet by mouth daily.     Marland Kitchen venlafaxine XR (EFFEXOR-XR) 150 MG 24 hr capsule Take 150 mg by mouth daily.      No current facility-administered medications for this visit.    Family History  Problem Relation Age of Onset  . Lung cancer Mother     and ETOH abuse  . Lung cancer Father     and ETOH abuse  . Throat cancer Brother     ETOH abuse    ROS:  Pertinent items are noted in HPI.  Otherwise, a comprehensive ROS was negative.  Exam:   BP 104/62 mmHg  Pulse 70  Resp 16  Ht 5' 0.75" (1.543 m)  Wt 222 lb (100.699 kg)  BMI 42.30 kg/m2  LMP 09/11/2002 Height: 5' 0.75" (154.3 cm)  Ht Readings from Last 3 Encounters:  04/15/14 5' 0.75" (1.543 m)  05/30/13 5\' 1"  (1.549 m)  04/09/13 5\' 1"  (1.549 m)    General appearance: alert, cooperative and appears stated age Head: Normocephalic, without obvious  abnormality, atraumatic Neck: no adenopathy, supple, symmetrical, trachea midline and thyroid normal to inspection and palpation Lungs: clear to auscultation bilaterally Breasts: normal appearance, no masses or tenderness Heart: regular rate and rhythm Abdomen: soft, non-tender; no masses,  no organomegaly Extremities: extremities normal, atraumatic, no cyanosis or edema Skin: Skin color, texture, turgor normal. No rashes or lesions Lymph nodes: Cervical, supraclavicular, and axillary nodes normal. No abnormal inguinal nodes palpated Neurologic: Grossly normal   Pelvic: External genitalia:  no lesions              Urethra:  normal appearing urethra with no masses, tenderness or lesions              Bartholin's and Skene's: normal                 Vagina: normal appearing vagina with normal color and discharge, no lesions              Cervix: anteverted              Pap taken: No. Bimanual Exam:  Uterus:   normal size, contour, position, consistency, mobility, non-tender              Adnexa: no mass, fullness, tenderness               Rectovaginal: Confirms               Anus:  normal sphincter tone, no lesions  A:  Well Woman with normal exam  Postmenopausal History of stress incontinence - has worked with Ileana Roup  P:   Reviewed health and wellness pertinent to exam  Pap smear not taken today  Still advise pt. Of mammogram - she declines and wants thermogram only  IFOB given today  Counseled on breast self exam, mammography screening, adequate intake of calcium and vitamin D, diet and exercise, Kegel's exercises return annually or prn  An After Visit Summary was printed and given to the patient.  ROI for labs done 12/15 Dr Kathryne Eriksson

## 2014-04-15 NOTE — Patient Instructions (Signed)

## 2014-04-21 NOTE — Progress Notes (Signed)
Encounter reviewed by Dr. Brook Silva.  

## 2015-04-23 ENCOUNTER — Ambulatory Visit (INDEPENDENT_AMBULATORY_CARE_PROVIDER_SITE_OTHER): Payer: Medicare Other | Admitting: Nurse Practitioner

## 2015-04-23 ENCOUNTER — Encounter: Payer: Self-pay | Admitting: Nurse Practitioner

## 2015-04-23 VITALS — BP 130/60 | HR 68 | Resp 14 | Ht 61.5 in | Wt 222.0 lb

## 2015-04-23 DIAGNOSIS — Z1211 Encounter for screening for malignant neoplasm of colon: Secondary | ICD-10-CM

## 2015-04-23 DIAGNOSIS — Z01419 Encounter for gynecological examination (general) (routine) without abnormal findings: Secondary | ICD-10-CM

## 2015-04-23 DIAGNOSIS — Z0000 Encounter for general adult medical examination without abnormal findings: Secondary | ICD-10-CM | POA: Diagnosis not present

## 2015-04-23 DIAGNOSIS — Z124 Encounter for screening for malignant neoplasm of cervix: Secondary | ICD-10-CM | POA: Diagnosis not present

## 2015-04-23 NOTE — Patient Instructions (Signed)

## 2015-04-23 NOTE — Progress Notes (Signed)
65 y.o. G0P0 Divorced  Caucasian Fe here for annual exam.  Pneumonia end of January of last year.  She has felt well this year other than complaints of vaginal dryness.  She does use baby wipes prn with a little relief at times.  She is not SA.  Patient's last menstrual period was 09/11/2002.          Sexually active: No.  The current method of family planning is post menopausal status.    Exercising: No.  Walking Smoker:  no  Health Maintenance: Pap:  04/09/13 Neg. HR HPV:neg MMG:  Thermogram 04/10/13 Normal. Will do one this year  Colonoscopy:  2010 polyps - Repeat  10 years  BMD:  04/25/13 Normal   TDaP:  2010 Shingles: No Pneumonia: No Hep C:  Will get done today Labs: PCP   reports that she has never smoked. She has never used smokeless tobacco. She reports that she drinks about 1.2 oz of alcohol per week. She reports that she does not use illicit drugs.  Past Medical History  Diagnosis Date  . GERD (gastroesophageal reflux disease)   . PONV (postoperative nausea and vomiting)   . Stress incontinence   . Osteoarthritis   . Depression   . Anxiety   . Hypertension     under control with med., has been on med. x 3-4 yr.  . History of asthma     no meds. in 2 yr.  . Lipoma of back 10/2012  . Pneumonia 04/2014    Past Surgical History  Procedure Laterality Date  . Appendectomy    . Knee arthroplasty Right 2009  . Tonsillectomy      age 40  . Lipoma excision Right 11/13/2012    Procedure: EXCISION LIPOMA;  Surgeon: Pedro Earls, MD;  Location: [facility];  Service: General;  Laterality: Right;    Current Outpatient Prescriptions  Medication Sig Dispense Refill  . benzonatate (TESSALON) 200 MG capsule   4  . CALCIUM & MAGNESIUM CARBONATES PO Take 2 capsules by mouth daily.    . Melatonin 5 MG TABS Take 5 mg by mouth every evening.    Marland Kitchen NUTRITIONAL SUPPLEMENTS PO Take by mouth daily. Serpeptase Enzymes    . omeprazole (PRILOSEC) 40 MG capsule Take 40 mg  by mouth 2 (two) times daily.     . valsartan-hydrochlorothiazide (DIOVAN-HCT) 320-12.5 MG per tablet Take 1 tablet by mouth daily.     Marland Kitchen venlafaxine XR (EFFEXOR-XR) 150 MG 24 hr capsule Take 150 mg by mouth daily.      No current facility-administered medications for this visit.    Family History  Problem Relation Age of Onset  . Lung cancer Mother     and ETOH abuse  . Lung cancer Father     and ETOH abuse  . Throat cancer Brother     ETOH abuse    ROS:  Pertinent items are noted in HPI.  Otherwise, a comprehensive ROS was negative.  Exam:   BP 130/60 mmHg  Pulse 68  Resp 14  Ht 5' 1.5" (1.562 m)  Wt 222 lb (100.699 kg)  BMI 41.27 kg/m2  LMP 09/11/2002 Height: 5' 1.5" (156.2 cm) Ht Readings from Last 3 Encounters:  04/23/15 5' 1.5" (1.562 m)  04/15/14 5' 0.75" (1.543 m)  05/30/13 5\' 1"  (1.549 m)    General appearance: alert, cooperative and appears stated age Head: Normocephalic, without obvious abnormality, atraumatic Neck: no adenopathy, supple, symmetrical, trachea midline and thyroid normal  to inspection and palpation Lungs: clear to auscultation bilaterally Breasts: normal appearance, no masses or tenderness Heart: regular rate and rhythm Abdomen: soft, non-tender; no masses,  no organomegaly Extremities: extremities normal, atraumatic, no cyanosis or edema Skin: Skin color, texture, turgor normal. No rashes or lesions Lymph nodes: Cervical, supraclavicular, and axillary nodes normal. No abnormal inguinal nodes palpated Neurologic: Grossly normal   Pelvic: External genitalia:  no lesions              Urethra:  normal appearing urethra with no masses, tenderness or lesions              Bartholin's and Skene's: normal                 Vagina: normal appearing vagina with normal color and discharge, no lesions              Cervix: anteverted              Pap taken: Yes.   Bimanual Exam:  Uterus:  normal size, contour, position, consistency, mobility,  non-tender              Adnexa: no mass, fullness, tenderness               Rectovaginal: Confirms               Anus:  normal sphincter tone, no lesions  Chaperone present: yes  A:  Well Woman with normal exam Postmenopausal Lapse of health care here for 6 years until 03/2013 - former pt. of Dr. Lenda Kelp History of stress incontinence - stable  Atrophic vaginitis   P:   Reviewed health and wellness pertinent to exam  Pap smear as above  Mammogram is due but she only does Thermogram and thinks she may do this year - if so she will get results sent to [place]  Discussed treatment options of atrophic vaginitis and she can not take vaginal estrogen as we would need a Mammogram.  She is comfortable with OTC options of Replens and Creme' de Femme'.  She is given an IFOB and will follow with Hep C  She declines any immunizations  Counseled on breast self exam, mammography screening, adequate intake of calcium and vitamin D, diet and exercise, Kegel's exercises return annually or prn  An After Visit Summary was printed and given to the patient.

## 2015-04-24 LAB — HEPATITIS C ANTIBODY: HCV AB: NEGATIVE

## 2015-04-25 LAB — IPS PAP SMEAR ONLY

## 2015-04-25 NOTE — Progress Notes (Signed)
Encounter reviewed by Dr. Biridiana Twardowski Amundson C. Silva.  

## 2015-04-29 ENCOUNTER — Telehealth: Payer: Self-pay | Admitting: *Deleted

## 2015-04-29 NOTE — Telephone Encounter (Signed)
I have attempted to contact this patient by phone with the following results: left message to return call to Warner at [phone] on answering machine (mobile per [facility]).  No personal information given.  [phone] (Mobile) *Preferred*

## 2015-04-29 NOTE — Telephone Encounter (Signed)
-----   Message from Kem Boroughs, [place] sent at 04/25/2015  8:31 AM EST ----- Please let patient know that Hep C was negative as expected.  Also again thank her for being so kind the other day.

## 2015-05-08 NOTE — Telephone Encounter (Signed)
Pt notified in result note.  Closing encounter. 

## 2015-09-23 DIAGNOSIS — R7309 Other abnormal glucose: Secondary | ICD-10-CM | POA: Insufficient documentation

## 2015-09-23 DIAGNOSIS — F32A Depression, unspecified: Secondary | ICD-10-CM | POA: Insufficient documentation

## 2015-09-23 DIAGNOSIS — K219 Gastro-esophageal reflux disease without esophagitis: Secondary | ICD-10-CM | POA: Insufficient documentation

## 2015-09-23 DIAGNOSIS — I10 Essential (primary) hypertension: Secondary | ICD-10-CM | POA: Insufficient documentation

## 2015-09-23 DIAGNOSIS — Z6841 Body Mass Index (BMI) 40.0 and over, adult: Secondary | ICD-10-CM

## 2015-09-23 DIAGNOSIS — F419 Anxiety disorder, unspecified: Secondary | ICD-10-CM | POA: Insufficient documentation

## 2015-09-23 DIAGNOSIS — J45909 Unspecified asthma, uncomplicated: Secondary | ICD-10-CM | POA: Insufficient documentation

## 2015-09-23 DIAGNOSIS — E669 Obesity, unspecified: Secondary | ICD-10-CM | POA: Insufficient documentation

## 2015-09-23 DIAGNOSIS — B009 Herpesviral infection, unspecified: Secondary | ICD-10-CM | POA: Insufficient documentation

## 2015-09-23 DIAGNOSIS — F329 Major depressive disorder, single episode, unspecified: Secondary | ICD-10-CM | POA: Insufficient documentation

## 2015-09-24 DIAGNOSIS — M25511 Pain in right shoulder: Secondary | ICD-10-CM | POA: Insufficient documentation

## 2015-09-24 DIAGNOSIS — M25512 Pain in left shoulder: Secondary | ICD-10-CM

## 2015-09-24 DIAGNOSIS — M542 Cervicalgia: Secondary | ICD-10-CM | POA: Insufficient documentation

## 2016-02-20 DIAGNOSIS — I480 Paroxysmal atrial fibrillation: Secondary | ICD-10-CM | POA: Insufficient documentation

## 2016-04-28 ENCOUNTER — Ambulatory Visit: Payer: BC Managed Care – PPO | Admitting: Nurse Practitioner

## 2016-05-18 ENCOUNTER — Ambulatory Visit: Payer: BC Managed Care – PPO | Admitting: Nurse Practitioner

## 2016-06-08 ENCOUNTER — Encounter (HOSPITAL_COMMUNITY): Payer: Self-pay

## 2016-06-08 ENCOUNTER — Ambulatory Visit (HOSPITAL_COMMUNITY): Admit: 2016-06-08 | Payer: BC Managed Care – PPO | Admitting: Cardiology

## 2016-06-08 SURGERY — CARDIOVERSION
Anesthesia: Monitor Anesthesia Care

## 2016-07-07 ENCOUNTER — Encounter: Payer: Self-pay | Admitting: Interventional Cardiology

## 2016-07-19 ENCOUNTER — Ambulatory Visit (INDEPENDENT_AMBULATORY_CARE_PROVIDER_SITE_OTHER): Payer: Medicare Other | Admitting: Interventional Cardiology

## 2016-07-19 ENCOUNTER — Encounter (INDEPENDENT_AMBULATORY_CARE_PROVIDER_SITE_OTHER): Payer: Self-pay

## 2016-07-19 ENCOUNTER — Encounter: Payer: Self-pay | Admitting: Interventional Cardiology

## 2016-07-19 VITALS — BP 140/76 | HR 42 | Resp 16 | Ht 61.0 in | Wt 214.0 lb

## 2016-07-19 DIAGNOSIS — I519 Heart disease, unspecified: Secondary | ICD-10-CM | POA: Insufficient documentation

## 2016-07-19 DIAGNOSIS — I4819 Other persistent atrial fibrillation: Secondary | ICD-10-CM

## 2016-07-19 DIAGNOSIS — I481 Persistent atrial fibrillation: Secondary | ICD-10-CM

## 2016-07-19 DIAGNOSIS — I119 Hypertensive heart disease without heart failure: Secondary | ICD-10-CM | POA: Diagnosis not present

## 2016-07-19 MED ORDER — XARELTO 20 MG PO TABS: 20.0000 mg | ORAL_TABLET | Freq: Every day | ORAL | 11 refills | Status: DC

## 2016-07-19 MED ORDER — AMIODARONE HCL 200 MG PO TABS: 200.0000 mg | ORAL_TABLET | Freq: Two times a day (BID) | ORAL | 11 refills | Status: DC

## 2016-07-19 MED ORDER — FUROSEMIDE 40 MG PO TABS: 40.0000 mg | ORAL_TABLET | Freq: Every day | ORAL | 11 refills | Status: DC

## 2016-07-19 MED ORDER — DILTIAZEM HCL ER COATED BEADS 120 MG PO CP24: 120.0000 mg | ORAL_CAPSULE | Freq: Every day | ORAL | 11 refills | Status: DC

## 2016-07-19 MED ORDER — VALSARTAN 320 MG PO TABS: 320.0000 mg | ORAL_TABLET | Freq: Every day | ORAL | 11 refills | Status: DC

## 2016-07-19 MED ORDER — METOPROLOL SUCCINATE ER 100 MG PO TB24: 100.0000 mg | ORAL_TABLET | Freq: Every day | ORAL | 11 refills | Status: DC

## 2016-07-19 NOTE — Patient Instructions (Signed)
Medication Instructions:  1) DECREASE DILTIAZEM to 120 mg daily  Labwork: TODAY: BMET  Testing/Procedures: Your physician has requested that you have an echocardiogram in July, 2018. Echocardiography is a painless test that uses sound waves to create images of your heart. It provides your doctor with information about the size and shape of your heart and how well your heart's chambers and valves are working. This procedure takes approximately one hour. There are no restrictions for this procedure.  Follow-Up: Your physician recommends that you schedule a follow-up appointment in 2-3 weeks with Dr. Irish Lack.  Any Other Special Instructions Will Be Listed Below (If Applicable).     If you need a refill on your cardiac medications before your next appointment, please call your pharmacy.

## 2016-07-19 NOTE — Progress Notes (Signed)
Cardiology Office Note   Date:  07/19/2016   ID:  Stacey Caldwell, DOB 1950/06/25, MRN [id number]  PCP:  Woody Seller, MD    No chief complaint on file.  AFib  Wt Readings from Last 3 Encounters:  07/19/16 214 lb (97.1 kg)  04/23/15 222 lb (100.7 kg)  04/15/14 222 lb (100.7 kg)       History of Present Illness: Stacey Caldwell is a 66 y.o. female  Who was diagnosed with AFib.  She noticed DOE in April 2017 while walking to her office.  She saw her PMD, Dr. Redmond Pulling.  There was no mention of an irregular heart beat.    DOE was worse in November.  SHe went back to Dr. Redmond Pulling.  Tachycardia was detected and she was found to be in AFib.  She was treated with Toprol but no blood thinner.  She had bleeding in her rectum, and then was started on Amiodarone.    She was sent to Dr. Irven Shelling office.  She saw Dr. Woody Seller initially.  Xarelto was started. Echo and nuclear stress were done.  Echo showed EF 35-40%.  She was told she was in heart failure and needed a cardioversion.  EF by nuclear stress test was 15%.  She was thought to have a nonischemic cardiomyopathy.    She wanted to change MDs so is here now.  She has felt better in the last few weeks.  She has done "body talk" alternative medicine.  Her DOE has improved.  No chest pain.  SHe never felt palpitations.      Past Medical History:  Diagnosis Date  . Anxiety   . Depression   . GERD (gastroesophageal reflux disease)   . History of asthma    no meds. in 2 yr.  . Hypertension    under control with med., has been on med. x 3-4 yr.  . Lipoma of back 10/2012  . Osteoarthritis   . Pneumonia 04/2014  . PONV (postoperative nausea and vomiting)   . Stress incontinence     Past Surgical History:  Procedure Laterality Date  . APPENDECTOMY    . KNEE ARTHROPLASTY Right 2009  . LIPOMA EXCISION Right 11/13/2012   Procedure: EXCISION LIPOMA;  Surgeon: Pedro Earls, MD;  Location: [facility];  Service: General;   Laterality: Right;  . TONSILLECTOMY     age 68     Current Outpatient Prescriptions  Medication Sig Dispense Refill  . amiodarone (PACERONE) 200 MG tablet     . benzonatate (TESSALON) 200 MG capsule   4  . CALCIUM & MAGNESIUM CARBONATES PO Take 2 capsules by mouth daily.    Marland Kitchen diltiazem (CARDIZEM CD) 240 MG 24 hr capsule Take 240 mg by mouth.    . furosemide (LASIX) 40 MG tablet     . Melatonin 5 MG TABS Take 5 mg by mouth every evening.    . metoprolol succinate (TOPROL-XL) 100 MG 24 hr tablet     . NUTRITIONAL SUPPLEMENTS PO Take by mouth daily. Serpeptase Enzymes    . omeprazole (PRILOSEC) 40 MG capsule Take 40 mg by mouth 2 (two) times daily.     . valsartan (DIOVAN) 320 MG tablet Take 320 mg by mouth daily.    Marland Kitchen venlafaxine XR (EFFEXOR-XR) 150 MG 24 hr capsule Take 150 mg by mouth daily.     Alveda Reasons 20 MG TABS tablet      No current facility-administered medications for this visit.  Allergies:   Codeine    Social History:  The patient  reports that she has never smoked. She has never used smokeless tobacco. She reports that she drinks about 1.2 oz of alcohol per week . She reports that she does not use drugs.   Family History:  The patient's family history includes Lung cancer in her father and mother; Throat cancer in her brother.    ROS:  Please see the history of present illness.   Otherwise, review of systems are positive for DOE.   All other systems are reviewed and negative.    PHYSICAL EXAM: VS:  BP 140/76   Pulse (!) 42   Resp 16   Ht 5\' 1"  (1.549 m)   Wt 214 lb (97.1 kg)   LMP 09/11/2002   BMI 40.43 kg/m  , BMI Body mass index is 40.43 kg/m. GEN: Well nourished, well developed, in no acute distress  HEENT: normal  Neck: no JVD, carotid bruits, or masses Cardiac: , bradycardic,RRR; no murmurs, rubs, or gallops,no edema  Respiratory:  clear to auscultation bilaterally, normal work of breathing GI: soft, nontender, nondistended, + BS MS: no deformity  or atrophy  Skin: warm and dry, no rash Neuro:  Strength and sensation are intact Psych: euthymic mood, full affect   EKG:   The ekg ordered today demonstrates sinus bradycardia, IRBBB, no ST changes   Recent Labs: No results found for requested labs within last 8760 hours.   Lipid Panel No results found for: CHOL, TRIG, HDL, CHOLHDL, VLDL, LDLCALC, LDLDIRECT   Other studies Reviewed: Additional studies/ records that were reviewed today with results demonstrating: Nuclear stress test showed no evidence of ischemia but severely decreased LV function. Echocardiogram showed EF of 35-40%.   ASSESSMENT AND PLAN:  1. AFib: Now in sinus bradycardia.  EF 35-40% per echo.  COntinue amiodarone and Toprol at current doses.  Decrease diltiazem to 120 mg daily.  If blood pressure increases, may need to add a nonrate slowing drugs. If her heart rate remains bradycardic, may be able to stop diltiazem altogether at the time of her next visit.  Continue Xarelto for stroke prevention. 2. Hypertension: Borderline control today. May worsen with decreasing diltiazem. 3. Volume overload: She has been treated with furosemide. Labs have not been checked for some time. She does have systolic dysfunction and likely had symptoms of chronic systolic heart failure. She now appears euvolemic.   This patients CHA2DS2-VASc Score and unadjusted Ischemic Stroke Rate (% per year) is equal to 4.8 % stroke rate/year from a score of 4  Above score calculated as 1 point each if present [CHF, HTN, DM, Vascular=MI/PAD/Aortic Plaque, Age if 65-74, or Female] Above score calculated as 2 points each if present [Age > 75, or Stroke/TIA/TE]    Repeat echocardiogram in July. Hopefully, her ejection fraction would've improved by that time with better rate control. If not, would have to consider further evaluation with cardiac cath. If her ejection fraction is back to normal, then no further workup would be needed.   Current  medicines are reviewed at length with the patient today.  The patient concerns regarding her medicines were addressed.  The following changes have been made:  As above  Labs/ tests ordered today include:  No orders of the defined types were placed in this encounter.   Recommend 150 minutes/week of aerobic exercise Low fat, low carb, high fiber diet recommended  Disposition:   FU in 2-3 weeks   Signed, Larae Grooms, MD  07/19/2016 2:47 PM    [address], [place], [place]  [address] Phone: [phone]; Fax: [phone]

## 2016-07-20 LAB — BASIC METABOLIC PANEL
BUN/Creatinine Ratio: 11 — ABNORMAL LOW (ref 12–28)
BUN: 8 mg/dL (ref 8–27)
CO2: 26 mmol/L (ref 18–29)
CREATININE: 0.75 mg/dL (ref 0.57–1.00)
Calcium: 8.9 mg/dL (ref 8.7–10.3)
Chloride: 98 mmol/L (ref 96–106)
GFR calc Af Amer: 95 mL/min/{1.73_m2} (ref >59)
GFR calc non Af Amer: 83 mL/min/{1.73_m2} (ref >59)
GLUCOSE: 103 mg/dL — AB (ref 65–99)
POTASSIUM: 4.3 mmol/L (ref 3.5–5.2)
SODIUM: 138 mmol/L (ref 134–144)

## 2016-07-20 NOTE — Addendum Note (Signed)
Addended by: Harland German A on: 07/20/2016 01:00 PM   Modules accepted: Orders

## 2016-07-21 ENCOUNTER — Telehealth: Payer: Self-pay | Admitting: Interventional Cardiology

## 2016-07-21 NOTE — Telephone Encounter (Signed)
-----   Message from Jettie Booze, MD sent at 07/20/2016 11:04 AM EDT ----- Electrolytes controlled. COntinue current meds.

## 2016-07-21 NOTE — Telephone Encounter (Signed)
Follow Up: ° ° ° °Returning your call, concerning her lab results. °

## 2016-07-21 NOTE — Telephone Encounter (Signed)
Patient made aware of results. Patient verbalizes understanding.  

## 2016-08-02 ENCOUNTER — Encounter: Payer: Self-pay | Admitting: Interventional Cardiology

## 2016-08-02 ENCOUNTER — Ambulatory Visit (INDEPENDENT_AMBULATORY_CARE_PROVIDER_SITE_OTHER): Payer: Medicare Other | Admitting: Interventional Cardiology

## 2016-08-02 VITALS — BP 140/60 | HR 50 | Ht 61.0 in | Wt 213.8 lb

## 2016-08-02 DIAGNOSIS — I481 Persistent atrial fibrillation: Secondary | ICD-10-CM

## 2016-08-02 DIAGNOSIS — I519 Heart disease, unspecified: Secondary | ICD-10-CM | POA: Diagnosis not present

## 2016-08-02 DIAGNOSIS — I4819 Other persistent atrial fibrillation: Secondary | ICD-10-CM

## 2016-08-02 DIAGNOSIS — I119 Hypertensive heart disease without heart failure: Secondary | ICD-10-CM | POA: Diagnosis not present

## 2016-08-02 NOTE — Patient Instructions (Addendum)
Your physician recommends that you continue on your current medications as directed. Please refer to the Current Medication list given to you today.  Your physician recommends that you schedule a follow-up appointment in:  [place]

## 2016-08-02 NOTE — Progress Notes (Signed)
Cardiology Office Note   Date:  08/02/2016   ID:  Stacey Caldwell, DOB 1950-08-06, MRN [id number]  PCP:  Woody Seller, MD    No chief complaint on file.  AFib  Wt Readings from Last 3 Encounters:  08/02/16 213 lb 12.8 oz (97 kg)  07/19/16 214 lb (97.1 kg)  04/23/15 222 lb (100.7 kg)       History of Present Illness: Stacey Caldwell is a 66 y.o. female  Who was diagnosed with AFib.  She noticed DOE in April 2017 while walking to her office.  She saw her PMD, Dr. Redmond Pulling.  There was no mention of an irregular heart beat.    DOE was worse in November.  SHe went back to Dr. Redmond Pulling.  Tachycardia was detected and she was found to be in AFib.  She was treated with Toprol but no blood thinner.  She had bleeding in her rectum, and then was started on Amiodarone.    She was sent to Dr. Irven Shelling office.  She saw Dr. Woody Seller initially.  Xarelto was started. Echo and nuclear stress were done.  Echo showed EF 35-40%.  She was told she was in heart failure and needed a cardioversion.  EF by nuclear stress test was 15%.  She was thought to have a nonischemic cardiomyopathy.    She wanted to change MDs so is here now.  She has felt better in the last few weeks.  She has done "body talk" alternative medicine.  Her DOE has improved.  No chest pain.  SHe never felt palpitations.    She has felt well since the last visit.  No palpitations.  Doing quite well.     Past Medical History:  Diagnosis Date  . Anxiety   . Depression   . GERD (gastroesophageal reflux disease)   . History of asthma    no meds. in 2 yr.  . Hypertension    under control with med., has been on med. x 3-4 yr.  . Lipoma of back 10/2012  . Osteoarthritis   . Pneumonia 04/2014  . PONV (postoperative nausea and vomiting)   . Stress incontinence     Past Surgical History:  Procedure Laterality Date  . APPENDECTOMY    . KNEE ARTHROPLASTY Right 2009  . LIPOMA EXCISION Right 11/13/2012   Procedure: EXCISION LIPOMA;   Surgeon: Pedro Earls, MD;  Location: [facility];  Service: General;  Laterality: Right;  . TONSILLECTOMY     age 38     Current Outpatient Prescriptions  Medication Sig Dispense Refill  . amiodarone (PACERONE) 200 MG tablet Take 1 tablet (200 mg total) by mouth 2 (two) times daily. 60 tablet 11  . benzonatate (TESSALON) 200 MG capsule   4  . CALCIUM & MAGNESIUM CARBONATES PO Take 2 capsules by mouth daily.    Marland Kitchen diltiazem (CARDIZEM CD) 120 MG 24 hr capsule Take 1 capsule (120 mg total) by mouth daily. 30 capsule 11  . furosemide (LASIX) 40 MG tablet Take 1 tablet (40 mg total) by mouth daily. 30 tablet 11  . Melatonin 5 MG TABS Take 5 mg by mouth every evening.    . metoprolol succinate (TOPROL-XL) 100 MG 24 hr tablet Take 1 tablet (100 mg total) by mouth daily. 30 tablet 11  . NUTRITIONAL SUPPLEMENTS PO Take by mouth daily. Serpeptase Enzymes    . omeprazole (PRILOSEC) 40 MG capsule Take 40 mg by mouth 2 (two) times daily.     Marland Kitchen  valsartan (DIOVAN) 320 MG tablet Take 1 tablet (320 mg total) by mouth daily. 30 tablet 11  . venlafaxine XR (EFFEXOR-XR) 150 MG 24 hr capsule Take 150 mg by mouth daily.     Alveda Reasons 20 MG TABS tablet Take 1 tablet (20 mg total) by mouth daily with supper. 30 tablet 11   No current facility-administered medications for this visit.     Allergies:   Codeine; Dextromethorphan hbr; Lisinopril; and Paroxetine hcl    Social History:  The patient  reports that she has never smoked. She has never used smokeless tobacco. She reports that she drinks about 1.2 oz of alcohol per week . She reports that she does not use drugs.   Family History:  The patient's family history includes Lung cancer in her father and mother; Throat cancer in her brother.    ROS:  Please see the history of present illness.   Otherwise, review of systems are positive for DOE.   All other systems are reviewed and negative.    PHYSICAL EXAM: VS:  BP 140/60 (BP Location:  Right Arm, Patient Position: Sitting, Cuff Size: Normal)   Pulse (!) 50   Ht 5\' 1"  (1.549 m)   Wt 213 lb 12.8 oz (97 kg)   LMP 09/11/2002   SpO2 98%   BMI 40.40 kg/m  , BMI Body mass index is 40.4 kg/m. GEN: Well nourished, well developed, in no acute distress  HEENT: normal  Neck: no JVD, carotid bruits, or masses Cardiac: , bradycardic,RRR; no murmurs, rubs, or gallops,no edema  Respiratory:  clear to auscultation bilaterally, normal work of breathing GI: soft, nontender, nondistended, + BS MS: no deformity or atrophy  Skin: warm and dry, no rash Neuro:  Strength and sensation are intact Psych: euthymic mood, full affect   EKG:   The ekg ordered today demonstrates sinus bradycardia, IRBBB, no ST changes   Recent Labs: 07/19/2016: BUN 8; Creatinine, Ser 0.75; Potassium 4.3; Sodium 138   Lipid Panel No results found for: CHOL, TRIG, HDL, CHOLHDL, VLDL, LDLCALC, LDLDIRECT   Other studies Reviewed: Additional studies/ records that were reviewed today with results demonstrating: Nuclear stress test showed no evidence of ischemia but severely decreased LV function. Echocardiogram showed EF of 35-40%.   ASSESSMENT AND PLAN:  1. AFib: Now in sinus bradycardia.  EF 35-40% per echo.  Continue amiodarone.  Decreased diltiazem to 120 mg daily.  Blood pressure well controlled at home- 992 systolic. HR controlled. Continue current meds.  Continue Xarelto for stroke prevention. 2. Hypertension: Well controlled at home.  3. Volume overload: Improved. COntinue furosemide. Labs have been stable. She does have systolic dysfunction and likely had symptoms of chronic systolic heart failure. She now appears euvolemic.   This patients CHA2DS2-VASc Score and unadjusted Ischemic Stroke Rate (% per year) is equal to 4.8 % stroke rate/year from a score of 4  Above score calculated as 1 point each if present [CHF, HTN, DM, Vascular=MI/PAD/Aortic Plaque, Age if 65-74, or Female] Above score calculated  as 2 points each if present [Age > 75, or Stroke/TIA/TE]    Repeat echocardiogram in July. Hopefully, her ejection fraction would've improved by that time with better rate control. If not, would have to consider further evaluation with cardiac cath. If her ejection fraction is back to normal, then no further workup would be needed.   Current medicines are reviewed at length with the patient today.  The patient concerns regarding her medicines were addressed.  The following changes  have been made:  As above  Labs/ tests ordered today include:  No orders of the defined types were placed in this encounter.   Recommend 150 minutes/week of aerobic exercise Low fat, low carb, high fiber diet recommended  Disposition:   FU in 3 months   Signed, Larae Grooms, MD  08/02/2016 4:31 PM    [facility] [address], [place], [place]  [address] Phone: [phone]; Fax: [phone]

## 2016-08-13 ENCOUNTER — Ambulatory Visit (INDEPENDENT_AMBULATORY_CARE_PROVIDER_SITE_OTHER): Payer: Medicare Other | Admitting: Nurse Practitioner

## 2016-08-13 ENCOUNTER — Encounter: Payer: Self-pay | Admitting: Nurse Practitioner

## 2016-08-13 VITALS — BP 140/82 | HR 58 | Resp 12 | Ht 61.25 in | Wt 213.4 lb

## 2016-08-13 DIAGNOSIS — Z01411 Encounter for gynecological examination (general) (routine) with abnormal findings: Secondary | ICD-10-CM

## 2016-08-13 DIAGNOSIS — E559 Vitamin D deficiency, unspecified: Secondary | ICD-10-CM | POA: Diagnosis not present

## 2016-08-13 DIAGNOSIS — R5383 Other fatigue: Secondary | ICD-10-CM

## 2016-08-13 NOTE — Patient Instructions (Signed)

## 2016-08-13 NOTE — Progress Notes (Signed)
66 y.o. G0P0 Divorced  Caucasian Fe here for annual exam.  She was seen for  A fib / Flutter on 02/20/16. Did not have cardioversion as she returned her heart rate to normal with medication therapy.  Has been on numerous medications and has been working well for heart.  At times she feels lightheaded.  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  She does have bilateral rotator cuff pain and is now seeing chiropractor.  She has already completed PT with little help.  Not SA or dating.  Patient's last menstrual period was 09/11/2002.          Sexually active: No.  The current method of family planning is post menopausal status.    Exercising: Yes.    Walking Smoker:  no  Health Maintenance: Pap: 04/23/15 WNL; 04/09/13 Neg: Neg HR HPV History of Abnormal Pap: no MMG:  03/2014 WNL per patient.   Self Breast exams: yes Colonoscopy: 2010 Polyps. Repeat 10 years BMD: 04/25/13 Osteopenia, L Femur Neck T-Score -1.1. Repeat 3 years. TDaP:  2010 Shingles: declines Pneumonia: declines Hep C: 04/23/15 Neg Labs: PCP   reports that she has never smoked. She has never used smokeless tobacco. She reports that she drinks about 1.2 oz of alcohol per week . She reports that she does not use drugs.  Past Medical History:  Diagnosis Date  . Anxiety   . Depression   . GERD (gastroesophageal reflux disease)   . History of asthma    no meds. in 2 yr.  . Hypertension    under control with med., has been on med. x 3-4 yr.  . Lipoma of back 10/2012  . Osteoarthritis   . Pneumonia 04/2014  . PONV (postoperative nausea and vomiting)   . Stress incontinence     Past Surgical History:  Procedure Laterality Date  . APPENDECTOMY    . KNEE ARTHROPLASTY Right 2009  . LIPOMA EXCISION Right 11/13/2012   Procedure: EXCISION LIPOMA;  Surgeon: Pedro Earls, MD;  Location: [facility];  Service: General;  Laterality: Right;  . TONSILLECTOMY     age 37    Current Outpatient Prescriptions   Medication Sig Dispense Refill  . amiodarone (PACERONE) 200 MG tablet Take 1 tablet (200 mg total) by mouth 2 (two) times daily. 60 tablet 11  . benzonatate (TESSALON) 200 MG capsule   4  . CALCIUM & MAGNESIUM CARBONATES PO Take 2 capsules by mouth daily.    Marland Kitchen diltiazem (CARDIZEM CD) 120 MG 24 hr capsule Take 1 capsule (120 mg total) by mouth daily. 30 capsule 11  . furosemide (LASIX) 40 MG tablet Take 1 tablet (40 mg total) by mouth daily. 30 tablet 11  . Melatonin 5 MG TABS Take 5 mg by mouth every evening.    . metoprolol succinate (TOPROL-XL) 100 MG 24 hr tablet Take 1 tablet (100 mg total) by mouth daily. 30 tablet 11  . NUTRITIONAL SUPPLEMENTS PO Take by mouth daily. Serpeptase Enzymes    . omeprazole (PRILOSEC) 40 MG capsule Take 40 mg by mouth 2 (two) times daily.     . valsartan (DIOVAN) 320 MG tablet Take 1 tablet (320 mg total) by mouth daily. 30 tablet 11  . venlafaxine XR (EFFEXOR-XR) 150 MG 24 hr capsule Take 150 mg by mouth daily.     Alveda Reasons 20 MG TABS tablet Take 1 tablet (20 mg total) by mouth daily with supper. 30 tablet 11   No current facility-administered  medications for this visit.     Family History  Problem Relation Age of Onset  . Lung cancer Mother     and ETOH abuse  . Lung cancer Father     and ETOH abuse  . Throat cancer Brother     ETOH abuse    ROS:  Pertinent items are noted in HPI.  Otherwise, a comprehensive ROS was negative.  Exam:   BP 140/82 (BP Location: Right Arm, Patient Position: Sitting, Cuff Size: Normal)   Pulse (!) 58   Resp 12   Ht 5' 1.25" (1.556 m)   Wt 213 lb 6.4 oz (96.8 kg)   LMP 09/11/2002   BMI 39.99 kg/m  Height: 5' 1.25" (155.6 cm) Ht Readings from Last 3 Encounters:  08/13/16 5' 1.25" (1.556 m)  08/02/16 5\' 1"  (1.549 m)  07/19/16 5\' 1"  (1.549 m)    General appearance: alert, cooperative and appears stated age Head: Normocephalic, without obvious abnormality, atraumatic Neck: no adenopathy, supple, symmetrical,  trachea midline and thyroid normal to inspection and palpation Lungs: clear to auscultation bilaterally Breasts: normal appearance, no masses or tenderness Heart: regular rate and rhythm Abdomen: soft, non-tender; no masses,  no organomegaly Extremities: extremities normal, atraumatic, no cyanosis or edema Skin: Skin color, texture, turgor normal. No rashes or lesions Lymph nodes: Cervical, supraclavicular, and axillary nodes normal. No abnormal inguinal nodes palpated Neurologic: Grossly normal   Pelvic: External genitalia:  no lesions              Urethra:  normal appearing urethra with no masses, tenderness or lesions              Bartholin's and Skene's: normal                 Vagina: normal appearing vagina with normal color and discharge, no lesions              Cervix: anteverted              Pap taken: No. Bimanual Exam:  Uterus:  normal size, contour, position, consistency, mobility, non-tender              Adnexa: no mass, fullness, tenderness               Rectovaginal: Confirms               Anus:  normal sphincter tone, no lesions  Chaperone present: yes  A:  Well Woman with normal exam  Postmenopausal - no HRT (Lapse of health care here for 6 years until 03/2013 - former pt. of Dr. Lenda Kelp) History of stress incontinence - stable             Atrophic vaginitis with no help with OTC lubrication - unable to use Vaginal estrogen  New diagnosis of A FIB and on Xarelto  History of Vit D deficiency  History of fatigue - will check Vit B 12 but could be related to HTN / Cardio med's   P:   Reviewed health and wellness pertinent to exam  Pap smear: no  Mammogram is due - but pt declines and states will get thermogram - she is given the name and number from previous report  Note faxed to [facility] for BMD.  Declines immunizations  Will follow with labs  Declines IFOB and did not return previous 2 yrs of IFOB's  Counseled on breast self exam, mammography  screening, adequate intake of calcium and vitamin D, diet and exercise, Kegel's  exercises return annually or prn  An After Visit Summary was printed and given to the patient.

## 2016-08-14 LAB — VITAMIN B12: Vitamin B-12: 513 pg/mL (ref 200–1100)

## 2016-08-14 LAB — VITAMIN D 25 HYDROXY (VIT D DEFICIENCY, FRACTURES): Vit D, 25-Hydroxy: 17 ng/mL — ABNORMAL LOW (ref 30–100)

## 2016-08-15 NOTE — Progress Notes (Signed)
Encounter reviewed by Dr. Brook Amundson C. Silva.  

## 2016-08-16 ENCOUNTER — Telehealth: Payer: Self-pay

## 2016-08-16 MED ORDER — VITAMIN D (ERGOCALCIFEROL) 1.25 MG (50000 UNIT) PO CAPS: 50000.0000 [IU] | ORAL_CAPSULE | ORAL | 0 refills | Status: DC

## 2016-08-16 NOTE — Telephone Encounter (Signed)
-----   Message from Kem Boroughs, [place] sent at 08/16/2016  8:29 AM EDT ----- Please let pt know that Vit D is very low at 17 - she needs to take RX Vit D per protocol and recheck in 3 months.  Her fatigue will get better on med's.  The Vit B 12 was in the normal range.

## 2016-08-16 NOTE — Telephone Encounter (Signed)
Left message for patient to call Summer back at [phone].

## 2016-08-16 NOTE — Telephone Encounter (Signed)
Spoke with patient, advised of results and recommendations as seen below per Kem Boroughs, NP. Rx for Vitamin D 50,000 IU q7 days to verified pharamcy on file. Patient scheduled for recheck on 11/11/16 at 1:30pm. Patient verbalizes understanding and is agreeable.  Routing to provider for final review. Patient is agreeable to disposition. Will close encounter.

## 2016-08-16 NOTE — Telephone Encounter (Signed)
Patient called back and will call back again later.

## 2016-10-20 ENCOUNTER — Ambulatory Visit (HOSPITAL_COMMUNITY): Payer: Medicare Other | Attending: Cardiovascular Disease

## 2016-10-20 ENCOUNTER — Encounter (INDEPENDENT_AMBULATORY_CARE_PROVIDER_SITE_OTHER): Payer: Self-pay

## 2016-10-20 ENCOUNTER — Other Ambulatory Visit: Payer: Self-pay

## 2016-10-20 DIAGNOSIS — I4819 Other persistent atrial fibrillation: Secondary | ICD-10-CM

## 2016-10-20 DIAGNOSIS — I481 Persistent atrial fibrillation: Secondary | ICD-10-CM

## 2016-10-21 ENCOUNTER — Telehealth: Payer: Self-pay | Admitting: Interventional Cardiology

## 2016-10-21 NOTE — Telephone Encounter (Signed)
-----   Message from Jettie Booze, MD sent at 10/21/2016  1:05 PM EDT ----- Overall normal LV function and valvular function.

## 2016-10-21 NOTE — Telephone Encounter (Signed)
Patient made aware of results. Patient verbalizes understanding.  

## 2016-10-21 NOTE — Telephone Encounter (Signed)
New message    Pt is calling asking for a call back from RN about test results.

## 2016-10-25 ENCOUNTER — Ambulatory Visit (INDEPENDENT_AMBULATORY_CARE_PROVIDER_SITE_OTHER): Payer: Medicare Other | Admitting: Interventional Cardiology

## 2016-10-25 ENCOUNTER — Encounter: Payer: Self-pay | Admitting: Interventional Cardiology

## 2016-10-25 ENCOUNTER — Encounter (INDEPENDENT_AMBULATORY_CARE_PROVIDER_SITE_OTHER): Payer: Self-pay

## 2016-10-25 VITALS — BP 122/50 | HR 44 | Ht 61.5 in | Wt 214.4 lb

## 2016-10-25 DIAGNOSIS — I4819 Other persistent atrial fibrillation: Secondary | ICD-10-CM

## 2016-10-25 DIAGNOSIS — R001 Bradycardia, unspecified: Secondary | ICD-10-CM

## 2016-10-25 DIAGNOSIS — I119 Hypertensive heart disease without heart failure: Secondary | ICD-10-CM

## 2016-10-25 DIAGNOSIS — I481 Persistent atrial fibrillation: Secondary | ICD-10-CM | POA: Diagnosis not present

## 2016-10-25 DIAGNOSIS — Z7901 Long term (current) use of anticoagulants: Secondary | ICD-10-CM | POA: Diagnosis not present

## 2016-10-25 MED ORDER — AMIODARONE HCL 200 MG PO TABS: 200.0000 mg | ORAL_TABLET | Freq: Every day | ORAL | 3 refills | Status: DC

## 2016-10-25 MED ORDER — XARELTO 20 MG PO TABS: 20.0000 mg | ORAL_TABLET | Freq: Every day | ORAL | 11 refills | Status: DC

## 2016-10-25 NOTE — Progress Notes (Signed)
Cardiology Office Note   Date:  10/25/2016   ID:  Stacey Caldwell, DOB Sep 01, 1950, MRN [id number]  PCP:  Christain Sacramento, MD    Chief Complaint  Patient presents with  . Follow-up     Wt Readings from Last 3 Encounters:  10/25/16 214 lb 6.4 oz (97.3 kg)  08/13/16 213 lb 6.4 oz (96.8 kg)  08/02/16 213 lb 12.8 oz (97 kg)       History of Present Illness: Stacey Caldwell is a 66 y.o. female  Who was diagnosed with AFib.  She noticed DOE in April 2017 while walking to her office.  She saw her PMD, Dr. Redmond Pulling.  There was no mention of an irregular heart beat.    DOE was worse in November.  SHe went back to Dr. Redmond Pulling.  Tachycardia was detected and she was found to be in AFib.  She was treated with Toprol but no blood thinner.  She had bleeding in her rectum, and then was started on Amiodarone.    She was sent to Dr. Irven Shelling office.  She saw Dr. Woody Seller initially.  Xarelto was started. Echo and nuclear stress were done.  Echo showed EF 35-40%.  She was told she was in heart failure and needed a cardioversion.  EF by nuclear stress test was 15%.  She was thought to have a nonischemic cardiomyopathy.    Repeat EF in July of 2018 showed: Overall normal LV function and valvular function.  Denies : Chest pain. Dizziness. Leg edema. Nitroglycerin use. Orthopnea. Palpitations. Paroxysmal nocturnal dyspnea. Shortness of breath. Syncope.   She rides a bike at the gym 2x/week for 30 minutes.    BP at home has been high.  HR has been low at home.       Past Medical History:  Diagnosis Date  . Anxiety   . Depression   . GERD (gastroesophageal reflux disease)   . History of asthma    no meds. in 2 yr.  . Hypertension    under control with med., has been on med. x 3-4 yr.  . Lipoma of back 10/2012  . Osteoarthritis   . Pneumonia 04/2014  . PONV (postoperative nausea and vomiting)   . Stress incontinence     Past Surgical History:  Procedure Laterality Date  . APPENDECTOMY      . KNEE ARTHROPLASTY Right 2009  . LIPOMA EXCISION Right 11/13/2012   Procedure: EXCISION LIPOMA;  Surgeon: Pedro Earls, MD;  Location: [facility];  Service: General;  Laterality: Right;  . TONSILLECTOMY     age 27     Current Outpatient Prescriptions  Medication Sig Dispense Refill  . amiodarone (PACERONE) 200 MG tablet Take 1 tablet (200 mg total) by mouth 2 (two) times daily. 60 tablet 11  . benzonatate (TESSALON) 200 MG capsule Take 200 mg by mouth 2 (two) times daily as needed for cough.   4  . CALCIUM & MAGNESIUM CARBONATES PO Take 2 capsules by mouth daily.    Marland Kitchen diltiazem (CARDIZEM CD) 120 MG 24 hr capsule Take 1 capsule (120 mg total) by mouth daily. 30 capsule 11  . furosemide (LASIX) 40 MG tablet Take 1 tablet (40 mg total) by mouth daily. 30 tablet 11  . Melatonin 5 MG TABS Take 5 mg by mouth every evening.    . metoprolol succinate (TOPROL-XL) 100 MG 24 hr tablet Take 1 tablet (100 mg total) by mouth daily. 30 tablet 11  .  NUTRITIONAL SUPPLEMENTS PO Take 1 Dose by mouth daily. Serpeptase Enzymes     . omeprazole (PRILOSEC) 40 MG capsule Take 40 mg by mouth 2 (two) times daily.     . valsartan (DIOVAN) 320 MG tablet Take 1 tablet (320 mg total) by mouth daily. 30 tablet 11  . venlafaxine XR (EFFEXOR-XR) 150 MG 24 hr capsule Take 150 mg by mouth daily.     . Vitamin D, Ergocalciferol, (DRISDOL) 50000 units CAPS capsule Take 1 capsule (50,000 Units total) by mouth every 7 (seven) days. 12 capsule 0  . XARELTO 20 MG TABS tablet Take 1 tablet (20 mg total) by mouth daily with supper. 30 tablet 11   No current facility-administered medications for this visit.     Allergies:   Codeine; Dextromethorphan hbr; Lisinopril; and Paroxetine hcl    Social History:  The patient  reports that she has never smoked. She has never used smokeless tobacco. She reports that she drinks about 1.2 oz of alcohol per week . She reports that she does not use drugs.   Family History:   The patient's family history includes Lung cancer in her father and mother; Throat cancer in her brother.    ROS:  Please see the history of present illness.   Otherwise, review of systems are positive for occasional leg shaking; swelling better.   All other systems are reviewed and negative.    PHYSICAL EXAM: VS:  BP (!) 122/50   Pulse (!) 44   Ht 5' 1.5" (1.562 m)   Wt 214 lb 6.4 oz (97.3 kg)   LMP 09/11/2002   BMI 39.85 kg/m  , BMI Body mass index is 39.85 kg/m. GEN: Well nourished, well developed, in no acute distress  HEENT: normal  Neck: no JVD, carotid bruits, or masses Cardiac: RRR; no murmurs, rubs, or gallops,no edema  Respiratory:  clear to auscultation bilaterally, normal work of breathing GI: soft, nontender, nondistended, + BS MS: no deformity or atrophy  Skin: warm and dry, no rash Neuro:  Strength and sensation are intact Psych: euthymic mood, full affect    Recent Labs: 07/19/2016: BUN 8; Creatinine, Ser 0.75; Potassium 4.3; Sodium 138   Lipid Panel No results found for: CHOL, TRIG, HDL, CHOLHDL, VLDL, LDLCALC, LDLDIRECT   Other studies Reviewed: Additional studies/ records that were reviewed today with results demonstrating: 2018 echo reviewed.   ASSESSMENT AND PLAN:  1. AFib: Decrease amiodarone to 200 mg daily.  If she maintains NSR, would decrease the Toprol to 50 mg daily.  Will check liver and thyroid tests.  2. Bradycardia: Related to medicines.  Will try to gradually wean them as tolerated. 3. Anticoagulated: Continue Xarelto. No bleeding problems at this time. 4. Instructed to try to increase exercise.  Guidelines below shared with the patient. She has started but is below the recommended frequency. 5. Hypertensive heart disease: Blood pressure controlled. Continue current medicines for blood pressure. I think her blood pressure will tolerate a lower dose of beta blocker.   Current medicines are reviewed at length with the patient today.  The  patient concerns regarding her medicines were addressed.  The following changes have been made:  Decrease amiodarone  Labs/ tests ordered today include:  No orders of the defined types were placed in this encounter.   Recommend 150 minutes/week of aerobic exercise Low fat, low carb, high fiber diet recommended  Disposition:   FU in 6 months   Signed, Larae Grooms, MD  10/25/2016 2:13 PM  [facility] [place], [place], [place]  [address] Phone: [phone]; Fax: [phone]

## 2016-10-25 NOTE — Patient Instructions (Signed)
Medication Instructions:  Your physician has recommended you make the following change in your medication:   DECREASE amiodarone to 200 mg daily    Labwork: LABS TODAY: LFTs, TSH  Testing/Procedures: None ordered  Follow-Up: Your physician wants you to follow-up in: 6 months with Dr. Irish Lack. You will receive a reminder letter in the mail two months in advance. If you don't receive a letter, please call our office to schedule the follow-up appointment.   Any Other Special Instructions Will Be Listed Below (If Applicable).  Call us back in 2-3 weeks to give [place] your heart rate readings   If you need a refill on your cardiac medications before your next appointment, please call your pharmacy.

## 2016-10-29 ENCOUNTER — Other Ambulatory Visit: Payer: Medicare Other | Admitting: *Deleted

## 2016-10-29 ENCOUNTER — Telehealth: Payer: Self-pay | Admitting: Interventional Cardiology

## 2016-10-29 NOTE — Telephone Encounter (Signed)
Patient made aware of LFT results. Patient made aware that TSH still pending. Patient verbalizes understanding.

## 2016-10-29 NOTE — Telephone Encounter (Signed)
-----   Message from Jettie Booze, MD sent at 10/29/2016 10:37 AM EDT ----- Normal LFTs

## 2016-10-29 NOTE — Telephone Encounter (Signed)
Stacey Caldwell is returning your call. Thanks

## 2016-10-30 LAB — TSH: TSH: 1.83 u[IU]/mL (ref 0.450–4.500)

## 2016-11-01 LAB — TSH: TSH: 2.78 u[IU]/mL (ref 0.450–4.500)

## 2016-11-01 LAB — HEPATIC FUNCTION PANEL
ALBUMIN: 4.1 g/dL (ref 3.6–4.8)
ALK PHOS: 81 IU/L (ref 39–117)
ALT: 20 IU/L (ref 0–32)
AST: 26 IU/L (ref 0–40)
Bilirubin Total: <0.2 mg/dL (ref 0.0–1.2)
Bilirubin, Direct: 0.06 mg/dL (ref 0.00–0.40)
TOTAL PROTEIN: 6.9 g/dL (ref 6.0–8.5)

## 2016-11-11 ENCOUNTER — Other Ambulatory Visit (INDEPENDENT_AMBULATORY_CARE_PROVIDER_SITE_OTHER): Payer: Medicare Other

## 2016-11-11 DIAGNOSIS — E559 Vitamin D deficiency, unspecified: Secondary | ICD-10-CM

## 2016-11-11 NOTE — Addendum Note (Signed)
Addended by: Abelino Derrick C on: 11/11/2016 11:11 AM   Modules accepted: Orders

## 2016-11-12 LAB — VITAMIN D 25 HYDROXY (VIT D DEFICIENCY, FRACTURES): Vit D, 25-Hydroxy: 16.5 ng/mL — ABNORMAL LOW (ref 30.0–100.0)

## 2016-11-15 ENCOUNTER — Telehealth: Payer: Self-pay

## 2016-11-15 NOTE — Telephone Encounter (Signed)
-----   Message from Nunzio Cobbs, MD sent at 11/12/2016  9:55 AM EDT ----- Please contact patient regarding continued low vit D level.  Please confirm if she was taking her vitamin D weekly.  If she was doing so, I would recommend taking vitamin D 50,000 IU po, twice weekly, for 3 months.  Please send to pharmacy of her choice. She will need a lab recheck in 3 months, so please schedule this as well.

## 2016-11-15 NOTE — Telephone Encounter (Signed)
Called patient at [phone] to discuss Vitamin D results, LMOVM to call me back.

## 2016-11-22 NOTE — Telephone Encounter (Signed)
Called patient to discuss vitamin D results, LMOVM to call me back.

## 2016-11-24 NOTE — Telephone Encounter (Signed)
Patient calling regarding results

## 2016-11-25 NOTE — Telephone Encounter (Signed)
Returned call to patient and per DPR, left detailed message stating I need her to call me back to discuss Vit D results and how she is taking her Vit D.

## 2016-11-30 ENCOUNTER — Other Ambulatory Visit: Payer: Self-pay | Admitting: Pharmacist

## 2016-11-30 MED ORDER — VALSARTAN 160 MG PO TABS: 320.0000 mg | ORAL_TABLET | Freq: Every day | ORAL | 1 refills | Status: DC

## 2016-11-30 NOTE — Telephone Encounter (Signed)
Request from pharmacy for RX for valsartan 160mg  2 tablets daily due to backorder on 320mg  daily. RX sent.

## 2016-12-15 NOTE — Telephone Encounter (Signed)
Called patient and LMOVM to call me. Letter sent to patient.

## 2017-01-03 NOTE — Telephone Encounter (Signed)
Dr.Silva, I haven't been able to speak with patient regarding Vitamin D. I sent a letter, but she hasn't responded. Okay to close encounter? Routed to Dr.Silva

## 2017-01-03 NOTE — Telephone Encounter (Signed)
I reviewed you note and I closed the encounter.

## 2017-05-12 ENCOUNTER — Encounter (INDEPENDENT_AMBULATORY_CARE_PROVIDER_SITE_OTHER): Payer: Self-pay

## 2017-05-12 ENCOUNTER — Encounter: Payer: Self-pay | Admitting: Interventional Cardiology

## 2017-05-12 ENCOUNTER — Ambulatory Visit (INDEPENDENT_AMBULATORY_CARE_PROVIDER_SITE_OTHER): Payer: Medicare Other | Admitting: Interventional Cardiology

## 2017-05-12 VITALS — BP 122/70 | HR 47 | Ht 61.5 in | Wt 210.6 lb

## 2017-05-12 DIAGNOSIS — I481 Persistent atrial fibrillation: Secondary | ICD-10-CM | POA: Diagnosis not present

## 2017-05-12 DIAGNOSIS — I4819 Other persistent atrial fibrillation: Secondary | ICD-10-CM

## 2017-05-12 DIAGNOSIS — R001 Bradycardia, unspecified: Secondary | ICD-10-CM | POA: Diagnosis not present

## 2017-05-12 DIAGNOSIS — I119 Hypertensive heart disease without heart failure: Secondary | ICD-10-CM

## 2017-05-12 DIAGNOSIS — Z7901 Long term (current) use of anticoagulants: Secondary | ICD-10-CM | POA: Diagnosis not present

## 2017-05-12 MED ORDER — VALSARTAN 160 MG PO TABS: 320.0000 mg | ORAL_TABLET | Freq: Every day | ORAL | 1 refills | Status: DC

## 2017-05-12 MED ORDER — METOPROLOL SUCCINATE ER 50 MG PO TB24: 50.0000 mg | ORAL_TABLET | Freq: Every day | ORAL | 3 refills | Status: DC

## 2017-05-12 NOTE — Patient Instructions (Signed)
Medication Instructions:  Your physician has recommended you make the following change in your medication:   DECREASE: metoprolol succinate (Toprol XL) to 50 mg daily  Labwork: TODAY: CMET, CBC, TSH  Testing/Procedures: None ordered  Follow-Up: Your physician wants you to follow-up in: 1 year with Dr. Irish Lack. You will receive a reminder letter in the mail two months in advance. If you don't receive a letter, please call our office to schedule the follow-up appointment.   Any Other Special Instructions Will Be Listed Below (If Applicable).     If you need a refill on your cardiac medications before your next appointment, please call your pharmacy.

## 2017-05-12 NOTE — Progress Notes (Signed)
Cardiology Office Note   Date:  05/12/2017   ID:  Stacey Caldwell, DOB 1950-10-28, MRN [id number]  PCP:  Christain Sacramento, MD    No chief complaint on file. Atrial fbrillation   Wt Readings from Last 3 Encounters:  05/12/17 210 lb 9.6 oz (95.5 kg)  10/25/16 214 lb 6.4 oz (97.3 kg)  08/13/16 213 lb 6.4 oz (96.8 kg)       History of Present Illness: Stacey Caldwell is a 67 y.o. female  Who was diagnosed with AFib. She noticed DOE in April 2017 while walking to her office. She saw her PMD, Dr. Redmond Pulling. There was no mention of an irregular heart beat.   DOE was worse in November. SHe went back to Dr. Redmond Pulling. Tachycardia was detected and she was found to be in AFib. She was treated with Toprol but no blood thinner. She had bleeding in her rectum, and then was started on Amiodarone.   She was sent to Dr. Irven Shelling office. She saw Dr. Woody Seller initially. Xarelto was started. Echo and nuclear stress were done. Echo showed EF 35-40%. She was told she was in heart failure and needed a cardioversion. EF by nuclear stress test was 15%. She was thought to have a nonischemic cardiomyopathy.   Repeat EF in July of 2018 showed: Overall normal LV function and valvular function.  Decreased amiodarone to 200 mg daily in July 2018.  Planned to decrease Toprol in 1/19 if NSR was maintained.  Denies : Chest pain. Dizziness. Leg edema. Nitroglycerin use. Orthopnea. Palpitations. Paroxysmal nocturnal dyspnea. Shortness of breath. Syncope.   Occasional anxiety.  Exercise is mostly riding the bike. She does that 2x/week.  No problems with that activity.     Past Medical History:  Diagnosis Date  . Anxiety   . Depression   . GERD (gastroesophageal reflux disease)   . History of asthma    no meds. in 2 yr.  . Hypertension    under control with med., has been on med. x 3-4 yr.  . Lipoma of back 10/2012  . Osteoarthritis   . Pneumonia 04/2014  . PONV (postoperative nausea and  vomiting)   . Stress incontinence     Past Surgical History:  Procedure Laterality Date  . APPENDECTOMY    . KNEE ARTHROPLASTY Right 2009  . LIPOMA EXCISION Right 11/13/2012   Procedure: EXCISION LIPOMA;  Surgeon: Pedro Earls, MD;  Location: [facility];  Service: General;  Laterality: Right;  . TONSILLECTOMY     age 74     Current Outpatient Medications  Medication Sig Dispense Refill  . amiodarone (PACERONE) 200 MG tablet Take 1 tablet (200 mg total) by mouth daily. 90 tablet 3  . benzonatate (TESSALON) 200 MG capsule Take 200 mg by mouth 2 (two) times daily as needed for cough.   4  . CALCIUM & MAGNESIUM CARBONATES PO Take 2 capsules by mouth daily.    Marland Kitchen diltiazem (CARDIZEM CD) 120 MG 24 hr capsule Take 1 capsule (120 mg total) by mouth daily. 30 capsule 11  . furosemide (LASIX) 40 MG tablet Take 1 tablet (40 mg total) by mouth daily. 30 tablet 11  . Melatonin 5 MG TABS Take 5 mg by mouth every evening.    . metoprolol succinate (TOPROL-XL) 100 MG 24 hr tablet Take 1 tablet (100 mg total) by mouth daily. 30 tablet 11  . NUTRITIONAL SUPPLEMENTS PO Take 1 Dose by mouth daily. Serpeptase Enzymes     .  omeprazole (PRILOSEC) 40 MG capsule Take 40 mg by mouth 2 (two) times daily.     . valsartan (DIOVAN) 160 MG tablet Take 2 tablets (320 mg total) by mouth daily. 180 tablet 1  . venlafaxine XR (EFFEXOR-XR) 150 MG 24 hr capsule Take 150 mg by mouth daily.     Alveda Reasons 20 MG TABS tablet Take 1 tablet (20 mg total) by mouth daily with supper. 30 tablet 11   No current facility-administered medications for this visit.     Allergies:   Codeine; Dextromethorphan hbr; Lisinopril; and Paroxetine hcl    Social History:  The patient  reports that  has never smoked. she has never used smokeless tobacco. She reports that she drinks about 1.2 oz of alcohol per week. She reports that she does not use drugs.   Family History:  The patient's family history includes Lung cancer in  her father and mother; Throat cancer in her brother.    ROS:  Please see the history of present illness.   Otherwise, review of systems are positive for occasional anxiety.   All other systems are reviewed and negative.    PHYSICAL EXAM: VS:  BP 122/70   Pulse (!) 47   Ht 5' 1.5" (1.562 m)   Wt 210 lb 9.6 oz (95.5 kg)   LMP 09/11/2002   SpO2 96%   BMI 39.15 kg/m  , BMI Body mass index is 39.15 kg/m. GEN: Well nourished, well developed, in no acute distress  HEENT: normal  Neck: no JVD, carotid bruits, or masses Cardiac: bradycardia; no murmurs, rubs, or gallops,no edema  Respiratory:  clear to auscultation bilaterally, normal work of breathing GI: soft, nontender, nondistended, + BS MS: no deformity or atrophy  Skin: warm and dry, no rash Neuro:  Strength and sensation are intact Psych: euthymic mood, full affect   EKG:   The ekg ordered today demonstrates sinus bradycardia, IRBBB, no no ST changes   Recent Labs: 07/19/2016: BUN 8; Creatinine, Ser 0.75; Potassium 4.3; Sodium 138 10/25/2016: ALT 20 10/29/2016: TSH 1.830   Lipid Panel No results found for: CHOL, TRIG, HDL, CHOLHDL, VLDL, LDLCALC, LDLDIRECT   Other studies Reviewed: Additional studies/ records that were reviewed today with results demonstrating: July 2018 TSH checked.   ASSESSMENT AND PLAN:  1. AFib: Decreased amiodarone to 200 mg daily in July 2018.  Maintaining NSR. She needs TSH nad LFTs q 6 months. 2. Bradycardia: Decrease Toprol to 50 mg daily.  No sx of bradycardia. 3. Anticoagulated: COntinue Xarelto.  WIll need to check labs.   4. Hypertensive heart disease:  BP controlled. COntinue current meds not mentioned about.   Current medicines are reviewed at length with the patient today.  The patient concerns regarding her medicines were addressed.  The following changes have been made:  Decrease Toprol  Labs/ tests ordered today include:  No orders of the defined types were placed in this  encounter.   Recommend 150 minutes/week of aerobic exercise Low fat, low carb, high fiber diet recommended  Disposition:   FU in 1 year   Signed, Larae Grooms, MD  05/12/2017 4:37 PM    [facility] [address], [place], [place]  [address] Phone: [phone]; Fax: [phone]

## 2017-05-13 LAB — COMPREHENSIVE METABOLIC PANEL
ALBUMIN: 4 g/dL (ref 3.6–4.8)
ALT: 14 IU/L (ref 0–32)
AST: 20 IU/L (ref 0–40)
Albumin/Globulin Ratio: 1.8 (ref 1.2–2.2)
Alkaline Phosphatase: 76 IU/L (ref 39–117)
BUN / CREAT RATIO: 18 (ref 12–28)
BUN: 13 mg/dL (ref 8–27)
Bilirubin Total: 0.2 mg/dL (ref 0.0–1.2)
CALCIUM: 8.7 mg/dL (ref 8.7–10.3)
CO2: 22 mmol/L (ref 20–29)
CREATININE: 0.72 mg/dL (ref 0.57–1.00)
Chloride: 104 mmol/L (ref 96–106)
GFR calc non Af Amer: 86 mL/min/{1.73_m2} (ref >59)
GFR, EST AFRICAN AMERICAN: 100 mL/min/{1.73_m2} (ref >59)
GLUCOSE: 103 mg/dL — AB (ref 65–99)
Globulin, Total: 2.2 g/dL (ref 1.5–4.5)
Potassium: 4.3 mmol/L (ref 3.5–5.2)
Sodium: 142 mmol/L (ref 134–144)
TOTAL PROTEIN: 6.2 g/dL (ref 6.0–8.5)

## 2017-05-13 LAB — CBC
HEMATOCRIT: 39.5 % (ref 34.0–46.6)
Hemoglobin: 13.5 g/dL (ref 11.1–15.9)
MCH: 31 pg (ref 26.6–33.0)
MCHC: 34.2 g/dL (ref 31.5–35.7)
MCV: 91 fL (ref 79–97)
Platelets: 298 10*3/uL (ref 150–379)
RBC: 4.35 x10E6/uL (ref 3.77–5.28)
RDW: 13.6 % (ref 12.3–15.4)
WBC: 7.6 10*3/uL (ref 3.4–10.8)

## 2017-05-13 LAB — TSH: TSH: 2.1 u[IU]/mL (ref 0.450–4.500)

## 2017-06-24 ENCOUNTER — Other Ambulatory Visit: Payer: Self-pay | Admitting: Interventional Cardiology

## 2017-07-27 ENCOUNTER — Other Ambulatory Visit: Payer: Self-pay | Admitting: Interventional Cardiology

## 2017-08-24 ENCOUNTER — Ambulatory Visit: Payer: BC Managed Care – PPO | Admitting: Nurse Practitioner

## 2017-10-21 ENCOUNTER — Other Ambulatory Visit: Payer: Self-pay | Admitting: Interventional Cardiology

## 2017-10-24 ENCOUNTER — Other Ambulatory Visit: Payer: Self-pay | Admitting: Interventional Cardiology

## 2017-11-25 ENCOUNTER — Other Ambulatory Visit: Payer: Self-pay | Admitting: Interventional Cardiology

## 2018-03-19 ENCOUNTER — Other Ambulatory Visit: Payer: Self-pay | Admitting: Interventional Cardiology

## 2018-03-21 ENCOUNTER — Ambulatory Visit: Payer: BC Managed Care – PPO | Admitting: Certified Nurse Midwife

## 2018-04-13 ENCOUNTER — Other Ambulatory Visit: Payer: Self-pay | Admitting: Interventional Cardiology

## 2018-04-14 ENCOUNTER — Other Ambulatory Visit: Payer: Self-pay | Admitting: Interventional Cardiology

## 2018-04-17 ENCOUNTER — Other Ambulatory Visit: Payer: Self-pay | Admitting: Interventional Cardiology

## 2018-04-17 NOTE — Telephone Encounter (Signed)
Pt is a 68 yr old female who last saw Dr Irish Lack on 05/12/17, wt at that visit was 95.5 Kg. SCr on that day was 0.72, CrCl is 111 mL/nin,  will refill Xarelto 20mg  QD.

## 2018-04-28 ENCOUNTER — Other Ambulatory Visit (HOSPITAL_COMMUNITY)
Admission: RE | Admit: 2018-04-28 | Discharge: 2018-04-28 | Disposition: A | Discharge status: Home / Self Care | Payer: Medicare Other | Source: Ambulatory Visit | Attending: Obstetrics & Gynecology | Admitting: Obstetrics & Gynecology

## 2018-04-28 ENCOUNTER — Other Ambulatory Visit: Payer: Self-pay

## 2018-04-28 ENCOUNTER — Encounter: Payer: Self-pay | Admitting: Certified Nurse Midwife

## 2018-04-28 ENCOUNTER — Ambulatory Visit (INDEPENDENT_AMBULATORY_CARE_PROVIDER_SITE_OTHER): Payer: Medicare Other | Admitting: Certified Nurse Midwife

## 2018-04-28 VITALS — BP 120/68 | HR 68 | Resp 16 | Ht 59.75 in | Wt 199.0 lb

## 2018-04-28 DIAGNOSIS — Z124 Encounter for screening for malignant neoplasm of cervix: Secondary | ICD-10-CM | POA: Insufficient documentation

## 2018-04-28 DIAGNOSIS — N951 Menopausal and female climacteric states: Secondary | ICD-10-CM | POA: Diagnosis not present

## 2018-04-28 DIAGNOSIS — Z01419 Encounter for gynecological examination (general) (routine) without abnormal findings: Secondary | ICD-10-CM | POA: Diagnosis not present

## 2018-04-28 NOTE — Progress Notes (Signed)
68 y.o. G0P0 Divorced  Caucasian Fe here for annual exam. Menopausal no vaginal bleeding. Has vaginal dryness and would like help with. Continues with Dr. Redmond Pulling for care for hypertension, anxiety/depression. Sees cardiology for Atrial Fib management, stable per patient. See Pulmonary for asthma management. Has not had mammogram and does not plant to. Does thermogram only, aware this not recommended. Has not noted any breast changes. No other health issues.  Patient's last menstrual period was 09/11/2002.          Sexually active: No.  The current method of family planning is post menopausal status.    Exercising: Yes.    gym Smoker:  no  Review of Systems  Constitutional: Negative.   HENT: Negative.   Eyes: Negative.   Respiratory: Negative.   Cardiovascular: Negative.   Gastrointestinal: Negative.   Genitourinary: Negative.   Musculoskeletal: Negative.   Skin: Negative.   Neurological: Negative.   Endo/Heme/Allergies: Negative.   Psychiatric/Behavioral: Negative.     Health Maintenance: Pap:  04-23-15 neg History of Abnormal Pap: no MMG:  12/15 neg per patient Self Breast exams: yes Colonoscopy:  2010 f/u 8yrs BMD:   2015 TDaP:  2011 Shingles: declines Pneumonia: declines Hep C and HIV: hep c neg 2017 Labs: no   reports that she has never smoked. She has never used smokeless tobacco. She reports current alcohol use. She reports that she does not use drugs.  Past Medical History:  Diagnosis Date  . Anxiety   . Depression   . GERD (gastroesophageal reflux disease)   . History of asthma    no meds. in 2 yr.  . Hypertension    under control with med., has been on med. x 3-4 yr.  . Lipoma of back 10/2012  . Osteoarthritis   . Pneumonia 04/2014  . PONV (postoperative nausea and vomiting)   . Stress incontinence     Past Surgical History:  Procedure Laterality Date  . APPENDECTOMY    . KNEE ARTHROPLASTY Right 2009  . LIPOMA EXCISION Right 11/13/2012   Procedure:  EXCISION LIPOMA;  Surgeon: Pedro Earls, MD;  Location: [facility];  Service: General;  Laterality: Right;  . TONSILLECTOMY     age 28    Current Outpatient Medications  Medication Sig Dispense Refill  . amiodarone (PACERONE) 200 MG tablet Take 1 tablet (200 mg total) by mouth daily. Please keep upcoming appt for future refills. Thank you. 90 tablet 0  . benzonatate (TESSALON) 200 MG capsule Take 200 mg by mouth 2 (two) times daily as needed for cough.   4  . CALCIUM & MAGNESIUM CARBONATES PO Take 2 capsules by mouth daily.    Marland Kitchen diltiazem (CARDIZEM CD) 120 MG 24 hr capsule Take 1 capsule (120 mg total) by mouth daily. PLEASE MAKE YEARLY APPT WITH DR FOR [facility] FOR FUTURE REFILLS 2nd attempt 15 capsule 0  . furosemide (LASIX) 40 MG tablet Take 1 tablet (40 mg total) by mouth daily. Please keep upcoming appt for future refills. Thank you. 90 tablet 0  . Melatonin 5 MG TABS Take 5 mg by mouth every evening.    . metoprolol succinate (TOPROL-XL) 50 MG 24 hr tablet Take 50 mg by mouth daily. Take with or immediately following a meal.    . NUTRITIONAL SUPPLEMENTS PO Take 1 Dose by mouth daily. Serpeptase Enzymes     . omeprazole (PRILOSEC) 20 MG capsule Take by mouth.    . valsartan (DIOVAN) 160 MG tablet TAKE 2 TABLETS (  320 MG TOTAL) BY MOUTH DAILY. 180 tablet 1  . venlafaxine XR (EFFEXOR-XR) 150 MG 24 hr capsule Take 150 mg by mouth daily.     Alveda Reasons 20 MG TABS tablet TAKE 1 TABLET (20 MG TOTAL) BY MOUTH DAILY WITH SUPPER. 90 tablet 0   No current facility-administered medications for this visit.     Family History  Problem Relation Age of Onset  . Lung cancer Mother        and ETOH abuse  . Lung cancer Father        and ETOH abuse  . Throat cancer Brother        ETOH abuse    ROS:  Pertinent items are noted in HPI.  Otherwise, a comprehensive ROS was negative.  Exam:   BP 120/68   Pulse 68   Resp 16   Ht 4' 11.75" (1.518 m)   Wt 199 lb (90.3 kg)   LMP  09/11/2002   BMI 39.19 kg/m  Height: 4' 11.75" (151.8 cm) Ht Readings from Last 3 Encounters:  04/28/18 4' 11.75" (1.518 m)  05/12/17 5' 1.5" (1.562 m)  10/25/16 5' 1.5" (1.562 m)    General appearance: alert, cooperative and appears stated age Head: Normocephalic, without obvious abnormality, atraumatic Neck: no adenopathy, supple, symmetrical, trachea midline and thyroid normal to inspection and palpation Lungs: clear to auscultation bilaterally Breasts: normal appearance, no masses or tenderness, No nipple retraction or dimpling, No nipple discharge or bleeding, No axillary or supraclavicular adenopathy Heart: regular rate and rhythm Abdomen: soft, non-tender; no masses,  no organomegaly Extremities: extremities normal, atraumatic, no cyanosis or edema Skin: Skin color, texture, turgor normal. No rashes or lesions Lymph nodes: Cervical, supraclavicular, and axillary nodes normal. No abnormal inguinal nodes palpated Neurologic: Grossly normal   Pelvic: External genitalia:  no lesions              Urethra:  normal appearing urethra with no masses, tenderness or lesions              Bartholin's and Skene's: normal                 Vagina: normal appearing vagina with normal color and discharge, no lesions              Cervix: no cervical motion tenderness, no lesions and normal appearance              Pap taken: Yes.   Bimanual Exam:  Uterus:  normal size, contour, position, consistency, mobility, non-tender and mid position              Adnexa: normal adnexa and no mass, fullness, tenderness               Rectovaginal: Confirms               Anus:  normal sphincter tone, no lesions  Chaperone present: yes  A:  Well Woman with normal exam  Post menopausal  Vaginal dryness  History of asthma, continues breathing issues  Hypertension, atrial fib, Anxiety with MD management  Mammogram due plans to do thermogram  P:   Reviewed health and wellness pertinent to exam  Aware of  need to advise if vaginal bleeding  Discussed vaginal dryness and coconut or Olive oil use for moisture. Instructions given, will advise if no change  Continue follow up with MD as indicated  Pap smear: yes   counseled on breast self exam, mammography screening, discussed thermogram is not  recognized as adequate screening for breast cancer, adequate intake of calcium and vitamin D, diet and exercise  return annually or prn  An After Visit Summary was printed and given to the patient.

## 2018-04-28 NOTE — Patient Instructions (Signed)

## 2018-04-30 ENCOUNTER — Other Ambulatory Visit: Payer: Self-pay | Admitting: Interventional Cardiology

## 2018-05-02 LAB — CYTOLOGY - PAP
Diagnosis: NEGATIVE
HPV: NOT DETECTED

## 2018-05-05 ENCOUNTER — Other Ambulatory Visit: Payer: Self-pay | Admitting: Interventional Cardiology

## 2018-05-16 ENCOUNTER — Ambulatory Visit: Payer: Medicare Other | Admitting: Physician Assistant

## 2018-05-27 ENCOUNTER — Other Ambulatory Visit: Payer: Self-pay | Admitting: Interventional Cardiology

## 2018-06-06 ENCOUNTER — Encounter: Payer: Self-pay | Admitting: Physician Assistant

## 2018-06-06 ENCOUNTER — Ambulatory Visit (INDEPENDENT_AMBULATORY_CARE_PROVIDER_SITE_OTHER): Payer: Medicare Other | Admitting: Physician Assistant

## 2018-06-06 VITALS — BP 118/70 | HR 54 | Ht 59.75 in | Wt 181.4 lb

## 2018-06-06 DIAGNOSIS — Z6841 Body Mass Index (BMI) 40.0 and over, adult: Secondary | ICD-10-CM

## 2018-06-06 DIAGNOSIS — I480 Paroxysmal atrial fibrillation: Secondary | ICD-10-CM

## 2018-06-06 DIAGNOSIS — I429 Cardiomyopathy, unspecified: Secondary | ICD-10-CM | POA: Insufficient documentation

## 2018-06-06 DIAGNOSIS — R0689 Other abnormalities of breathing: Secondary | ICD-10-CM

## 2018-06-06 DIAGNOSIS — I420 Dilated cardiomyopathy: Secondary | ICD-10-CM

## 2018-06-06 DIAGNOSIS — I10 Essential (primary) hypertension: Secondary | ICD-10-CM

## 2018-06-06 DIAGNOSIS — R0683 Snoring: Secondary | ICD-10-CM

## 2018-06-06 DIAGNOSIS — Z79899 Other long term (current) drug therapy: Secondary | ICD-10-CM

## 2018-06-06 NOTE — Patient Instructions (Signed)
Medication Instructions:  Your physician has recommended you make the following change in your medication:   STOP: diltiazem  Lab work: TODAY: CBC, CMET, TSH  If you have labs (blood work) drawn today and your tests are completely normal, you will receive your results only by: Marland Kitchen MyChart Message (if you have MyChart) OR . A paper copy in the mail If you have any lab test that is abnormal or we need to change your treatment, we will call you to review the results.  Testing/Procedures: Your physician has recommended that you have a sleep study. This test records several body functions during sleep, including: brain activity, eye movement, oxygen and carbon dioxide blood levels, heart rate and rhythm, breathing rate and rhythm, the flow of air through your mouth and nose, snoring, body muscle movements, and chest and belly movement.  A chest x-ray takes a picture of the organs and structures inside the chest, including the heart, lungs, and blood vessels. This test can show several things, including, whether the heart is enlarges; whether fluid is building up in the lungs; and whether pacemaker / defibrillator leads are still in place. Chest X-ray Instructions:    1. You may have this done at the [facility], located in the        [place] on the 1st floor.    2. You do no have to have an appointment.    3. [place], [place] [address]        [phone]        Monday - Friday  8:00 am - 5:00 pm   Follow-Up: You have been referred to Pulmonology  At [facility], you and your health needs are our priority.  As part of our continuing mission to provide you with exceptional heart care, we have created designated Provider Care Teams.  These Care Teams include your primary Cardiologist (physician) and Advanced Practice Providers (APPs -  Physician Assistants and Nurse Practitioners) who all work together to provide you with the care you  need, when you need it. . You will need a follow up appointment in 1 year.  Please call our office 2 months in advance to schedule this appointment.  You may see Casandra Doffing, MD or one of the following Advanced Practice Providers on your designated Care Team:   . Lyda Jester, PA-C . Dayna Dunn, PA-C . Ermalinda Barrios, PA-C  Any Other Special Instructions Will Be Listed Below (If Applicable).

## 2018-06-06 NOTE — Progress Notes (Signed)
Cardiology Office Note    Date:  06/06/2018   ID:  Stacey Caldwell, Stacey Caldwell Feb 14, 1951, MRN [id number]  PCP:  Christain Sacramento, MD  Cardiologist: Larae Grooms, MD EPS: None  Chief Complaint  Patient presents with  . Follow-up    History of Present Illness:  Stacey Caldwell is a 68 y.o. female history of hypertension, paroxysmal atrial fibrillation initially treated with Toprol but no blood thinners because of rectal bleeding.  Later started on amiodarone and Xarelto.  Seen by Dr. Einar Gip and told she was in heart failure and needed cardioversion EF 15% on NST echo EF 35 to 40%.  She was thought to have a nonischemic cardiomyopathy.  Repeat echo 10/2016 normal LV function and valve function.  Last saw Dr. Irish Lack 05/12/2017 at which time he decreased her Toprol to 50 mg daily because of bradycardia.  Recommended TSH and LFTs in 6 months.  Continue Xarelto.  Patient comes in for yearly f/u. Says her pulse is running low 50's and having trouble getting her balance when she first gets up.No dizziness HR only gets fast if she forgets her meds. Goes to the gym twice a week for 1 hr. Works with a Clinical research associate. Does a little bit of everything. Still has chronic dyspnea on exertion that hasn't improved with treatment of her Afib.  Used to be on inhalers but they did not help.   Past Medical History:  Diagnosis Date  . Anxiety   . Depression   . GERD (gastroesophageal reflux disease)   . History of asthma    no meds. in 2 yr.  . Hypertension    under control with med., has been on med. x 3-4 yr.  . Lipoma of back 10/2012  . Osteoarthritis   . Pneumonia 04/2014  . PONV (postoperative nausea and vomiting)   . Stress incontinence     Past Surgical History:  Procedure Laterality Date  . APPENDECTOMY    . KNEE ARTHROPLASTY Right 2009  . LIPOMA EXCISION Right 11/13/2012   Procedure: EXCISION LIPOMA;  Surgeon: Pedro Earls, MD;  Location: [facility];  Service: General;   Laterality: Right;  . TONSILLECTOMY     age 23    Current Medications: Current Meds  Medication Sig  . amiodarone (PACERONE) 200 MG tablet Take 1 tablet (200 mg total) by mouth daily. Please keep upcoming appt for future refills. Thank you.  . benzonatate (TESSALON) 200 MG capsule Take 200 mg by mouth 2 (two) times daily as needed for cough.   Marland Kitchen CALCIUM & MAGNESIUM CARBONATES PO Take 2 capsules by mouth daily.  . furosemide (LASIX) 40 MG tablet Take 1 tablet (40 mg total) by mouth daily. Please keep upcoming appt for future refills. Thank you.  . Melatonin 5 MG TABS Take 5 mg by mouth every evening.  . metoprolol succinate (TOPROL-XL) 50 MG 24 hr tablet Take 1 tablet (50 mg total) by mouth daily. With or immediately after meal. Please keep upcoming appt in February. Thank you  . NUTRITIONAL SUPPLEMENTS PO Take 1 Dose by mouth daily. Serpeptase Enzymes   . omeprazole (PRILOSEC) 20 MG capsule Take by mouth.  . valsartan (DIOVAN) 160 MG tablet Take 2 tablets (320 mg total) by mouth daily. Please keep upcoming appt in February for future refills. Thank you  . venlafaxine XR (EFFEXOR-XR) 150 MG 24 hr capsule Take 150 mg by mouth daily.   Alveda Reasons 20 MG TABS tablet TAKE 1 TABLET (20 MG TOTAL)  BY MOUTH DAILY WITH SUPPER.  . [DISCONTINUED] diltiazem (CARDIZEM CD) 120 MG 24 hr capsule TAKE 1 CAPSULE BY MOUTH EVERY DAY     Allergies:   Codeine; Dextromethorphan hbr; Lisinopril; and Paroxetine hcl   Social History   Socioeconomic History  . Marital status: Divorced    Spouse name: Not on file  . Number of children: 0  . Years of education: Not on file  . Highest education level: Not on file  Occupational History  . Not on file  Social Needs  . Financial resource strain: Not on file  . Food insecurity:    Worry: Not on file    Inability: Not on file  . Transportation needs:    Medical: Not on file    Non-medical: Not on file  Tobacco Use  . Smoking status: Never Smoker  . Smokeless  tobacco: Never Used  Substance and Sexual Activity  . Alcohol use: Yes    Comment: 2 a month  . Drug use: No  . Sexual activity: Not Currently    Partners: Male    Birth control/protection: Post-menopausal  Lifestyle  . Physical activity:    Days per week: Not on file    Minutes per session: Not on file  . Stress: Not on file  Relationships  . Social connections:    Talks on phone: Not on file    Gets together: Not on file    Attends religious service: Not on file    Active member of club or organization: Not on file    Attends meetings of clubs or organizations: Not on file    Relationship status: Not on file  Other Topics Concern  . Not on file  Social History Narrative  . Not on file     Family History:  The patient's family history includes Lung cancer in her father and mother; Throat cancer in her brother.   ROS:   Please see the history of present illness.    Review of Systems  Constitution: Negative.  HENT: Positive for hearing loss.   Eyes: Negative.   Cardiovascular: Positive for dyspnea on exertion.  Respiratory: Negative.   Hematologic/Lymphatic: Negative.   Musculoskeletal: Negative.  Negative for joint pain.  Gastrointestinal: Negative.   Genitourinary: Negative.   Neurological: Positive for loss of balance.   All other systems reviewed and are negative.   PHYSICAL EXAM:   VS:  BP 118/70   Pulse (!) 54   Ht 4' 11.75" (1.518 m)   Wt 181 lb 6.4 oz (82.3 kg)   LMP 09/11/2002   SpO2 96%   BMI 35.72 kg/m   Physical Exam  GEN: Obese, in no acute distress  Neck: no JVD, carotid bruits, or masses Cardiac:RRR; no murmurs, rubs, or gallops  Respiratory: Decreased breath sounds GI: soft, nontender, nondistended, + BS Ext: without cyanosis, clubbing, or edema, Good distal pulses bilaterally Neuro:  Alert and Oriented x 3 Psych: euthymic mood, full affect  Wt Readings from Last 3 Encounters:  06/06/18 181 lb 6.4 oz (82.3 kg)  04/28/18 199 lb (90.3 kg)   05/12/17 210 lb 9.6 oz (95.5 kg)      Studies/Labs Reviewed:   EKG:  EKG is  ordered today.  The ekg ordered today demonstrates sinus bradycardia at 54 bpm with incomplete right bundle branch block  Recent Labs: No results found for requested labs within last 8760 hours.   Lipid Panel No results found for: CHOL, TRIG, HDL, CHOLHDL, VLDL, LDLCALC, LDLDIRECT  Additional  studies/ records that were reviewed today include:   2D echo 10/20/2016 Study Conclusions   - Left ventricle: The cavity size was normal. There was mild   concentric hypertrophy. Systolic function was normal. The   estimated ejection fraction was in the range of 60% to 65%. Wall   motion was normal; there were no regional wall motion   abnormalities. Doppler parameters are consistent with abnormal   left ventricular relaxation (grade 1 diastolic dysfunction).   Doppler parameters are consistent with indeterminate ventricular   filling pressure. - Aortic valve: Transvalvular velocity was within the normal range.   There was no stenosis. There was no regurgitation. - Aorta: Ascending aortic diameter: 41 mm (S). - Ascending aorta: The ascending aorta was mildly dilated. - Mitral valve: Transvalvular velocity was within the normal range.   There was no evidence for stenosis. There was trivial   regurgitation. - Left atrium: The atrium was moderately to severely dilated. - Right ventricle: The cavity size was normal. Wall thickness was   normal. Systolic function was normal. - Atrial septum: No defect or patent foramen ovale was identified   by color flow Doppler. - Tricuspid valve: There was trivial regurgitation. - Pulmonary arteries: Systolic pressure was within the normal   range. PA peak pressure: 30 mm Hg (S).      ASSESSMENT:    1. PAF (paroxysmal atrial fibrillation) ([place])   2. Essential hypertension   3. Dilated cardiomyopathy ([place])   4. Morbid obesity with BMI of 40.0-44.9, adult ([place])   5.  Snoring   6. Long term current use of amiodarone   7. Difficulty breathing      PLAN:  In order of problems listed above:  PAF on Xarelto amiodarone and Toprol and diltiazem.  Patient bradycardic at 54 bpm and is off balance when she stands up.  Will stop diltiazem.  Patient takes her pulse daily at home.  Check LFTs and TSH.  Essential hypertension blood pressure stable.  Asked her to keep an eye on it with stopping diltiazem.  Dilated cardiomyopathy resolved normal LVEF 60 to 65% 2018  Morbid obesity weight loss would benefit her overall health.  Snoring with history of atrial fibrillation and chronic dyspnea on exertion.  Will schedule sleep apnea test  Long-term amiodarone use.  Will check chest x-ray and surveillance labs  Dyspnea on exertion with noisy breathing even at rest.  Refer back to pulmonary.  Is on amiodarone.  Will check chest x-ray and surveillance labs  Medication Adjustments/Labs and Tests Ordered: Current medicines are reviewed at length with the patient today.  Concerns regarding medicines are outlined above.  Medication changes, Labs and Tests ordered today are listed in the Patient Instructions below. Patient Instructions  Medication Instructions:  Your physician has recommended you make the following change in your medication:   STOP: diltiazem  Lab work: TODAY: CBC, CMET, TSH  If you have labs (blood work) drawn today and your tests are completely normal, you will receive your results only by: Marland Kitchen MyChart Message (if you have MyChart) OR . A paper copy in the mail If you have any lab test that is abnormal or we need to change your treatment, we will call you to review the results.  Testing/Procedures: Your physician has recommended that you have a sleep study. This test records several body functions during sleep, including: brain activity, eye movement, oxygen and carbon dioxide blood levels, heart rate and rhythm, breathing rate and rhythm, the flow  of air  through your mouth and nose, snoring, body muscle movements, and chest and belly movement.  A chest x-ray takes a picture of the organs and structures inside the chest, including the heart, lungs, and blood vessels. This test can show several things, including, whether the heart is enlarges; whether fluid is building up in the lungs; and whether pacemaker / defibrillator leads are still in place. Chest X-ray Instructions:    1. You may have this done at the [facility], located in the        [place] on the 1st floor.    2. You do no have to have an appointment.    3. [place], [place] [address]        [phone]        Monday - Friday  8:00 am - 5:00 pm   Follow-Up: You have been referred to Pulmonology  At [facility], you and your health needs are our priority.  As part of our continuing mission to provide you with exceptional heart care, we have created designated Provider Care Teams.  These Care Teams include your primary Cardiologist (physician) and Advanced Practice Providers (APPs -  Physician Assistants and Nurse Practitioners) who all work together to provide you with the care you need, when you need it. . You will need a follow up appointment in 1 year.  Please call our office 2 months in advance to schedule this appointment.  You may see Casandra Doffing, MD or one of the following Advanced Practice Providers on your designated Care Team:   . Lyda Jester, PA-C . Dayna Dunn, PA-C . Ermalinda Barrios, PA-C  Any Other Special Instructions Will Be Listed Below (If Applicable).       Signed, Ermalinda Barrios, PA-C  06/06/2018 1:55 PM    [facility] [address], [place], [place]  [address] Phone: [phone]; Fax: [phone]

## 2018-06-07 LAB — COMPREHENSIVE METABOLIC PANEL
ALT: 9 IU/L (ref 0–32)
AST: 18 IU/L (ref 0–40)
Albumin/Globulin Ratio: 2.3 — ABNORMAL HIGH (ref 1.2–2.2)
Albumin: 4.4 g/dL (ref 3.8–4.8)
Alkaline Phosphatase: 69 IU/L (ref 39–117)
BUN/Creatinine Ratio: 19 (ref 12–28)
BUN: 13 mg/dL (ref 8–27)
Bilirubin Total: 0.2 mg/dL (ref 0.0–1.2)
CO2: 22 mmol/L (ref 20–29)
Calcium: 9 mg/dL (ref 8.7–10.3)
Chloride: 98 mmol/L (ref 96–106)
Creatinine, Ser: 0.67 mg/dL (ref 0.57–1.00)
GFR calc Af Amer: 104 mL/min/{1.73_m2} (ref >59)
GFR calc non Af Amer: 90 mL/min/{1.73_m2} (ref >59)
Globulin, Total: 1.9 g/dL (ref 1.5–4.5)
Glucose: 91 mg/dL (ref 65–99)
Potassium: 4.5 mmol/L (ref 3.5–5.2)
Sodium: 138 mmol/L (ref 134–144)
Total Protein: 6.3 g/dL (ref 6.0–8.5)

## 2018-06-07 LAB — CBC
Hematocrit: 41.3 % (ref 34.0–46.6)
Hemoglobin: 13.7 g/dL (ref 11.1–15.9)
MCH: 30.6 pg (ref 26.6–33.0)
MCHC: 33.2 g/dL (ref 31.5–35.7)
MCV: 92 fL (ref 79–97)
PLATELETS: 295 10*3/uL (ref 150–450)
RBC: 4.47 x10E6/uL (ref 3.77–5.28)
RDW: 13.2 % (ref 11.7–15.4)
WBC: 7.2 10*3/uL (ref 3.4–10.8)

## 2018-06-07 LAB — TSH: TSH: 0.923 u[IU]/mL (ref 0.450–4.500)

## 2018-06-08 ENCOUNTER — Telehealth: Payer: Self-pay

## 2018-06-08 NOTE — Telephone Encounter (Signed)
Patient called and states that she does not want to have a sleep study done anymore. She states that she has had nightmares for the past 2 nights and has changed her mind and does not want to have it done. Instructed the patient to let us know if her S/Sx change or if she changes her mind.

## 2018-06-13 ENCOUNTER — Institutional Professional Consult (permissible substitution): Payer: Medicare Other | Admitting: Pulmonary Disease

## 2018-07-01 ENCOUNTER — Other Ambulatory Visit: Payer: Self-pay | Admitting: Interventional Cardiology

## 2018-07-05 ENCOUNTER — Telehealth: Payer: Self-pay | Admitting: Internal Medicine

## 2018-07-05 ENCOUNTER — Encounter: Payer: Self-pay | Admitting: Internal Medicine

## 2018-07-05 ENCOUNTER — Ambulatory Visit (INDEPENDENT_AMBULATORY_CARE_PROVIDER_SITE_OTHER): Payer: Medicare Other | Admitting: Internal Medicine

## 2018-07-05 ENCOUNTER — Other Ambulatory Visit: Payer: Self-pay

## 2018-07-05 VITALS — BP 114/62 | HR 47 | Ht 60.0 in | Wt 192.6 lb

## 2018-07-05 DIAGNOSIS — R0602 Shortness of breath: Secondary | ICD-10-CM

## 2018-07-05 DIAGNOSIS — Z9229 Personal history of other drug therapy: Secondary | ICD-10-CM

## 2018-07-05 DIAGNOSIS — R0609 Other forms of dyspnea: Secondary | ICD-10-CM

## 2018-07-05 NOTE — Patient Instructions (Signed)
ICD-10-CM   1. Dyspnea on exertion R06.09   2. History of amiodarone therapy Z92.29     Shortness of breath is likely due to multiple reasons that include low heart rate, weight, physical deconditioning, atrial fibrillation and stiff heart muscle  However, we need to rule out any pulmonary lung tissue problem or airway problem   Plan -We have agreed because of the ongoing coronavirus pandemic that we would put off any testing given the risks -If you get worse ytouy  will go to the emergency room  -Do high-resolution CT chest supine and prone in 3 months -Do overnight oxygen study in 3 months on room air -Do full pulmonary function test in 3 months  Follow-up -Return in 3 months but after all the above testing

## 2018-07-05 NOTE — Progress Notes (Signed)
Subjective:    Patient ID: Stacey Caldwell, female    DOB: 04/27/1950, 68 y.o.   MRN: [id number]  Chief Complaint  Patient presents with  . Consult    Referred by Dr. Estella Husk due to long term amiodarone use.   Pt states she has complaints of SOB which she states she has all the time but it is worse with exertion. Pt does have a-fib and states at times she will have pains in chest and tightness due to that.     HPI  IOV 07/05/2018  Chief Complaint  Patient presents with  . Consult    Referred by Dr. Estella Husk due to long term amiodarone use.   Pt states she has complaints of SOB which she states she has all the time but it is worse with exertion. Pt does have a-fib and states at times she will have pains in chest and tightness due to that.     Stacey Caldwell -is a new consult for shortness of breath.  She tells me that she has multiple medical problems including obesity and atrial fibrillation and on different heart medications.  She also tells me that for the last 20 years she has had insidious onset of shortness of breath that is slowly progressive.  Currently quite symptomatic and documented below in terms of severity.  Made worse with exertion relieved by rest.  There is no associated wheezing or cough or orthopnea proximal nocturnal dyspnea.  There is significantly associated fatigue present.  In the last 2 years she has had accelerated worsening which is what she said initially but later said just steady progressive worsening over 20 years.  2 years ago she went on amiodarone.  She is concerned she might have amiodarone related lung issues.  When we walked her today we noticed that she was quite bradycardic at rest which she is aware of.  She was only able to walk 1 lap and then stop she did not desaturate. No relevant images available for my personal visualization   SYMPTOM SCALE - ILD 07/05/2018   O2 use Room air  Shortness of Breath 0 -> 5 scale with 5 being worst  (score 6 If unable to do)  At rest 2  Simple tasks - showers, clothes change, eating, shaving 3  Household (dishes, doing bed, laundry) 4  Shopping 3  Walking level at own pace 3  Walking keeping up with others of same age 57  Walking up Stairs 5  Walking up [place] 5  Total (40 - 48) Dyspnea Score 25  How bad is your cough? 0  How bad is your fatigue 5 - all the time       Simple office walk 185 feet x  3 laps goal with forehead probe 07/05/2018   O2 used Room air  Number laps completed 3 - did only 1 lap  Comments about pace slow  Resting Pulse Ox/HR 100% and 47/min  Final Pulse Ox/HR 98% and 98/min  Desaturated </= 88% no  Desaturated <= 3% points n  Got Tachycardic >/= 90/min NO - BRADY  Symptoms at end of test Mild dyspnea  Miscellaneous comments Stopped at 1 lap    Results for Stacey Caldwell, Stacey Caldwell (MRN [id number]) as of 07/05/2018 16:20  Ref. Range 06/06/2018 14:35  Creatinine Latest Ref Range: 0.57 - 1.00 mg/dL 0.67  Results for Stacey Caldwell, Stacey Caldwell (MRN [id number]) as of 07/05/2018 16:20  Ref. Range 06/06/2018 14:35  Hemoglobin Latest Ref Range: 11.1 -  15.9 g/dL 13.7     has a past medical history of Anxiety, Depression, GERD (gastroesophageal reflux disease), History of asthma, Hypertension, Lipoma of back (10/2012), Osteoarthritis, Pneumonia (04/2014), PONV (postoperative nausea and vomiting), and Stress incontinence.   reports that she has never smoked. She has never used smokeless tobacco.  Past Surgical History:  Procedure Laterality Date  . APPENDECTOMY    . KNEE ARTHROPLASTY Right 2009  . LIPOMA EXCISION Right 11/13/2012   Procedure: EXCISION LIPOMA;  Surgeon: Pedro Earls, MD;  Location: [facility];  Service: General;  Laterality: Right;  . TONSILLECTOMY     age 18    Allergies  Allergen Reactions  . Codeine Other (See Comments)    HEADACHE  . Dextromethorphan Hbr Other (See Comments)    unspecified  . Lisinopril Cough  . Paroxetine Hcl Other  (See Comments)    unspecified    Immunization History  Administered Date(s) Administered  . Tdap 09/24/2009    Family History  Problem Relation Age of Onset  . Lung cancer Mother        and ETOH abuse  . Lung cancer Father        and ETOH abuse  . Throat cancer Brother        ETOH abuse     Current Outpatient Medications:  .  amiodarone (PACERONE) 200 MG tablet, Take 1 tablet (200 mg total) by mouth daily. Please keep upcoming appt for future refills. Thank you., Disp: 90 tablet, Rfl: 0 .  benzonatate (TESSALON) 200 MG capsule, Take 200 mg by mouth 2 (two) times daily as needed for cough. , Disp: , Rfl: 4 .  CALCIUM & MAGNESIUM CARBONATES PO, Take 2 capsules by mouth daily., Disp: , Rfl:  .  furosemide (LASIX) 40 MG tablet, Take 1 tablet (40 mg total) by mouth daily. Please keep upcoming appt for future refills. Thank you., Disp: 90 tablet, Rfl: 0 .  Melatonin 5 MG TABS, Take 5 mg by mouth every evening., Disp: , Rfl:  .  metoprolol succinate (TOPROL-XL) 50 MG 24 hr tablet, Take 1 tablet (50 mg total) by mouth daily. With or immediately after meal. Please keep upcoming appt in February. Thank you, Disp: 90 tablet, Rfl: 0 .  NUTRITIONAL SUPPLEMENTS PO, Take 1 Dose by mouth daily. Serpeptase Enzymes , Disp: , Rfl:  .  omeprazole (PRILOSEC) 20 MG capsule, Take by mouth., Disp: , Rfl:  .  valsartan (DIOVAN) 160 MG tablet, Take 2 tablets (320 mg total) by mouth daily. Please keep upcoming appt in February for future refills. Thank you, Disp: 180 tablet, Rfl: 0 .  venlafaxine XR (EFFEXOR-XR) 150 MG 24 hr capsule, Take 150 mg by mouth daily. , Disp: , Rfl:  .  XARELTO 20 MG TABS tablet, TAKE 1 TABLET (20 MG TOTAL) BY MOUTH DAILY WITH SUPPER., Disp: 90 tablet, Rfl: 0    Review of Systems  Constitutional: Negative for fever and unexpected weight change.  HENT: Positive for congestion and postnasal drip. Negative for dental problem, ear pain, nosebleeds, rhinorrhea, sinus pressure,  sneezing, sore throat and trouble swallowing.   Eyes: Negative for redness and itching.  Respiratory: Positive for shortness of breath and wheezing. Negative for cough and chest tightness.   Cardiovascular: Positive for palpitations. Negative for leg swelling.  Gastrointestinal: Negative for nausea and vomiting.  Genitourinary: Negative for dysuria.  Musculoskeletal: Positive for joint swelling.  Skin: Negative for rash.  Allergic/Immunologic: Negative.  Negative for environmental allergies, food allergies and  immunocompromised state.  Neurological: Negative for headaches.  Hematological: Bruises/bleeds easily.  Psychiatric/Behavioral: Negative for dysphoric mood. The patient is nervous/anxious.        Objective:   Physical Exam  Vitals:   07/05/18 1607  BP: 114/62  Pulse: (!) 47  SpO2: 96%  Weight: 87.4 kg  Height: 5' (1.524 m)   General Appearance:  OBESE Head:  Normocephalic, without obvious abnormality, atraumatic Eyes:  PERRL - yes, conjunctiva/corneas - clear     Ears:  Normal external ear canals, both ears Nose:  G tube - no Throat:  ETT TUBE - no , OG tube - no Neck:  Supple,  No enlargement/tenderness/nodules Lungs: Clear to auscultation bilaterally, Heart:  S1 and S2 normal, no murmur, CVP - no.  Pressors - no Abdomen:  Soft, no masses, no organomegaly Genitalia / Rectal:  Not done Extremities:  Extremities- intact Skin:  ntact in exposed areas . Sacral area - no Neurologic:  Sedation - none -> RASS - +1 . Moves all 4s - yes. CAM-ICU - neg . Orientation - x3+          Assessment & Plan:     ICD-10-CM   1. Dyspnea on exertion R06.09 Pulse oximetry, overnight    Pulmonary function test  2. History of amiodarone therapy Z92.29   3. Shortness of breath R06.02 CT Chest High Resolution    Patient Instructions     ICD-10-CM   1. Dyspnea on exertion R06.09   2. History of amiodarone therapy Z92.29     Shortness of breath is likely due to multiple  reasons that include low heart rate, weight, physical deconditioning, atrial fibrillation and stiff heart muscle  However, we need to rule out any pulmonary lung tissue problem or airway problem   Plan -We have agreed because of the ongoing coronavirus pandemic that we would put off any testing given the risks -If you get worse ytouy  will go to the emergency room  -Do high-resolution CT chest supine and prone in 3 months -Do overnight oxygen study in 3 months on room air -Do full pulmonary function test in 3 months  Follow-up -Return in 3 months but after all the above testing       SIGNATURE    Dr. Brand Males, M.D., F.C.C.P,  Pulmonary and Critical Care Medicine Staff Physician, [place] Director - Interstitial Lung Disease  Program  Pulmonary [place] at [place], [place], [address]  Pager: [phone], If no answer or between  15:00h - 7:00h: call [phone] Telephone: [phone]  5:04 PM 07/05/2018

## 2018-07-05 NOTE — Telephone Encounter (Signed)
Spoke with pt. Advised her to keep her appointment at 4:00pm with MR. Nothing further was needed.

## 2018-07-09 ENCOUNTER — Other Ambulatory Visit: Payer: Self-pay | Admitting: Interventional Cardiology

## 2018-08-04 ENCOUNTER — Other Ambulatory Visit: Payer: Self-pay | Admitting: Interventional Cardiology

## 2018-08-07 ENCOUNTER — Other Ambulatory Visit: Payer: Self-pay

## 2018-08-07 MED ORDER — VALSARTAN 160 MG PO TABS: 320.0000 mg | ORAL_TABLET | Freq: Every day | ORAL | 2 refills | Status: DC

## 2018-08-11 ENCOUNTER — Other Ambulatory Visit: Payer: Self-pay | Admitting: Interventional Cardiology

## 2018-08-14 ENCOUNTER — Telehealth: Payer: Self-pay | Admitting: Interventional Cardiology

## 2018-08-14 NOTE — Telephone Encounter (Signed)
New message   Pt c/o medication issue:  1. Name of Medication: valsartan (DIOVAN) 160 MG tablet  2. How are you currently taking this medication (dosage and times per day)?1time daily  3. Are you having a reaction (difficulty breathing--STAT)? No   4. What is your medication issue? Patient states that this medications needs a new prescription. Please send to  [facility] on [address]

## 2018-08-15 NOTE — Telephone Encounter (Signed)
Called pt and left message informing pt that her medication was already sent to her pharmacy as requested and if she has any other problems, questions or concerns to call our office.

## 2018-09-28 ENCOUNTER — Telehealth: Payer: Self-pay | Admitting: Internal Medicine

## 2018-09-28 NOTE — Telephone Encounter (Signed)
ONO received by [facility] which was completed by pt 09/26/2018.  Due to MR being in hospital due to [place], results were reviewed by [facility].  Per Kenney Houseman, pt had 7hr 56min where O2 sats were above 90%, 39min where O2 sats were less than 89%, and 11sec where O2 sats were less than 88%. Pt does not qualify for O2 at bedtime.  Attempted to call pt but unable to reach. Per pt's DPR, it is okay to leave a detailed message on machine. Left a detailed message on pt's machine of the ONO results and let her know that she does not qualify for O2 at bedtime. Nothing further needed.

## 2018-09-29 ENCOUNTER — Telehealth: Payer: Self-pay | Admitting: *Deleted

## 2018-09-29 NOTE — Telephone Encounter (Signed)
Script Screening patients for COVID-19 and reviewing new operational procedures  Greeting - The reason I am calling is to share with you some new changes to our processes that are designed to help [place] keep everyone safe. Is now a good time to speak with you? Patient says "no' - ask them when you can call back and let them know it's important to do this prior to their appointment.  Patient says "yes" - Stacey Caldwell, Stacey Caldwell the first thing I need to do is ask you some screening Questions.  1. To the best of your knowledge, have you been in close contact with any one with a confirmed diagnosis of COVID 19? o No - proceed to next question  2. Have you had any one or more of the following: fever, chills, cough, shortness of breath or any flu-like symptoms? o No - proceed to next question  3. Have you been diagnosed with or have a previous diagnosis of COVID 19? o No - proceed to next question  4. I am going to go over a few other symptoms with you. Please let me know if you are experiencing any of the following: . Ear, nose or throat discomfort . A sore throat . Headache . Muscle pain . Diarrhea . Loss of taste or smell o No - proceed to next question  Thank you for answering these questions. Please know we will ask you these questions or similar questions when you arrive for your appointment and again it's how we are keeping everyone safe. Also, to keep you safe, please use the provided hand sanitizer when you enter the building. (Insert pt name), we are asking everyone in the building to wear a mask because they help [place] prevent the spread of germs. Do you have a mask of your own, if not, we are happy to provide one for you. The last thing I want to go over with you is the no visitor guidelines. This means no one can attend the appointment with you unless you need physical assistance. I understand this may be different from your past appointments and I know this may be difficult but  please know if someone is driving you we are happy to call them for you once your appointment is over.  [INSERT [name]  (Insert pt name) I've given you a lot of information, what questions do you have about what I've talked about today or your appointment tomorrow?

## 2018-10-02 ENCOUNTER — Ambulatory Visit (INDEPENDENT_AMBULATORY_CARE_PROVIDER_SITE_OTHER)
Admission: RE | Admit: 2018-10-02 | Discharge: 2018-10-02 | Disposition: A | Discharge status: Home / Self Care | Payer: Medicare Other | Source: Ambulatory Visit | Attending: Internal Medicine | Admitting: Internal Medicine

## 2018-10-02 ENCOUNTER — Other Ambulatory Visit: Payer: Self-pay

## 2018-10-02 DIAGNOSIS — R0602 Shortness of breath: Secondary | ICD-10-CM | POA: Diagnosis not present

## 2018-10-05 ENCOUNTER — Encounter: Payer: Self-pay | Admitting: Pulmonary Disease

## 2018-10-05 ENCOUNTER — Ambulatory Visit (INDEPENDENT_AMBULATORY_CARE_PROVIDER_SITE_OTHER): Payer: Medicare Other | Admitting: Pulmonary Disease

## 2018-10-05 ENCOUNTER — Other Ambulatory Visit: Payer: Self-pay

## 2018-10-05 ENCOUNTER — Telehealth: Payer: Self-pay | Admitting: Pulmonary Disease

## 2018-10-05 VITALS — BP 122/64 | HR 48 | Temp 97.9°F | Ht 60.0 in | Wt 193.0 lb

## 2018-10-05 DIAGNOSIS — R918 Other nonspecific abnormal finding of lung field: Secondary | ICD-10-CM

## 2018-10-05 DIAGNOSIS — R0609 Other forms of dyspnea: Secondary | ICD-10-CM | POA: Diagnosis not present

## 2018-10-05 DIAGNOSIS — Z6841 Body Mass Index (BMI) 40.0 and over, adult: Secondary | ICD-10-CM

## 2018-10-05 DIAGNOSIS — I480 Paroxysmal atrial fibrillation: Secondary | ICD-10-CM | POA: Diagnosis not present

## 2018-10-05 NOTE — Telephone Encounter (Signed)
10/05/2018 2232  Please route high-resolution CT chest results to patient's primary care provider.  Potentially showing thyroid nodule that needs thyroid ultrasound.  Primary care needs to manage this.  Please also contact primary care to notify them of this name.  Wyn Quaker, FNP

## 2018-10-05 NOTE — Patient Instructions (Addendum)
We have offered a pulmonary function test to you today but you have declined COVID testing which is requirement based off of COVID-19 guidelines  Could consider cardiopulmonary stress test in the future but this likely would also require COVID 19 testing  Continue to work on daily exercise  Follow-up with primary care regarding need for thyroid ultrasound  Return in about 1 year (around 10/05/2019), or if symptoms worsen or fail to improve, for Follow up with Dr. Purnell Shoemaker.   Coronavirus (COVID-19) Are you at risk?  Are you at risk for the Coronavirus (COVID-19)?  To be considered HIGH RISK for Coronavirus (COVID-19), you have to meet the following criteria:  . Traveled to [place], [place], [place], [place] or [place]; or in the [place] to [place], [place], [place], or [place]; and have fever, cough, and shortness of breath within the last 2 weeks of travel OR . Been in close contact with a person diagnosed with COVID-19 within the last 2 weeks and have fever, cough, and shortness of breath . IF YOU DO NOT MEET THESE CRITERIA, YOU ARE CONSIDERED LOW RISK FOR COVID-19.  What to do if you are HIGH RISK for COVID-19?  Marland Kitchen If you are having a medical emergency, call 911. . Seek medical care right away. Before you go to a doctor's office, urgent care or emergency department, call ahead and tell them about your recent travel, contact with someone diagnosed with COVID-19, and your symptoms. You should receive instructions from your physician's office regarding next steps of care.  . When you arrive at healthcare provider, tell the healthcare staff immediately you have returned from visiting [place], [place], [place], [place] or [place]; or traveled in the [place] to [place], [place], [place], or [place]; in the last two weeks or you have been in close contact with a person diagnosed with COVID-19 in the last 2 weeks.   . Tell the health care staff about your symptoms: fever,  cough and shortness of breath. . After you have been seen by a medical provider, you will be either: o Tested for (COVID-19) and discharged home on quarantine except to seek medical care if symptoms worsen, and asked to  - Stay home and avoid contact with others until you get your results (4-5 days)  - Avoid travel on public transportation if possible (such as bus, train, or airplane) or o Sent to the Emergency Department by EMS for evaluation, COVID-19 testing, and possible admission depending on your condition and test results.  What to do if you are LOW RISK for COVID-19?  Reduce your risk of any infection by using the same precautions used for avoiding the common cold or flu:  Marland Kitchen Wash your hands often with soap and warm water for at least 20 seconds.  If soap and water are not readily available, use an alcohol-based hand sanitizer with at least 60% alcohol.  . If coughing or sneezing, cover your mouth and nose by coughing or sneezing into the elbow areas of your shirt or coat, into a tissue or into your sleeve (not your hands). . Avoid shaking hands with others and consider head nods or verbal greetings only. . Avoid touching your eyes, nose, or mouth with unwashed hands.  . Avoid close contact with people who are sick. . Avoid places or events with large numbers of people in one location, like concerts or sporting events. . Carefully consider travel plans you have or are making. . If you  are planning any travel outside or inside the [place], visit the CDC's Travelers' Health webpage for the latest health notices. . If you have some symptoms but not all symptoms, continue to monitor at home and seek medical attention if your symptoms worsen. . If you are having a medical emergency, call 911.   [place] / e-Visit: [email or web address]         [facility]: [facility]: [phone]                   [facility]: [phone]           It is flu season:   >>> Best ways to protect herself from the flu: Receive the yearly flu vaccine, practice good hand hygiene washing with soap and also using hand sanitizer when available, eat a nutritious meals, get adequate rest, hydrate appropriately   Please contact the office if your symptoms worsen or you have concerns that you are not improving.   Thank you for choosing [facility] for your healthcare, and for allowing [place] to partner with you on your healthcare journey. I am thankful to be able to provide care to you today.   Wyn Quaker FNP-C

## 2018-10-05 NOTE — Assessment & Plan Note (Signed)
Plan: Actively work on reducing BMI and achieving a healthy weight

## 2018-10-05 NOTE — Progress Notes (Signed)
@Patient  ID: Stacey Caldwell, female    DOB: 07/09/1950, 68 y.o.   MRN: [id number]  Chief Complaint  Patient presents with  . Follow-up    follow up, HRCT results and ONO results    Referring provider: Christain [place], MD  HPI:  68 year old female never smoker initially referred to our office on 07/05/2018 for dyspnea on exertion  PMH: Anxiety, depression, GERD, hypertension, morbid obesity Smoker/ Smoking History: Never smoker Maintenance: None Pt of: Dr. Chase Caller  10/05/2018  - Visit   68 year old female never smoker presenting to our office today to review multiple results.  Patient recently completed an overnight oximetry test which showed she did not need oxygen at night.  Patient also completed a high-resolution CT chest which showed she did not have interstitial lung disease or any sort of chronic lung problem findings on her CT chest to account for dyspnea on exertion.  Unfortunately patient was also supposed to complete a pulmonary function test with the patient has refused to receive preliminary COVID testing prior to performing paralyzing procedure.  The patient reports that she continues to keep with her stedtance that she will not be tested for COVID to perform a procedure.  Patient reports that she is had some limited physical activity due to COVID-19 pandemic.  She reports that she tries to walk daily on her property for about 30 minutes.   Tests:   09/26/2018- overnight oximetry test- 7 hours and 7 minutes were oxygen sats were above 90%, 1 minute were oxygen saturations were less than 89% and 11 seconds were oxygen saturations were less than 88%, patient did not qualify for oxygen  10/02/2018-CT chest high-res-no evidence of fibrotic interstitial lung disease, no significant air trapping, difficult to discretely visualize there is an enlarged partially partially calcified nodule in the inferior pole of the right lobe of the thyroid measuring least 2.3 cm, consider  dedicated thyroid ultrasound to further evaluate  10/20/2016-echocardiogram- LV ejection fraction 60 to 09%, grade 1 diastolic dysfunction, PA P pressure of 30  SIX MIN WALK 07/05/2018  Supplimental Oxygen during Test? (L/min) No  Tech Comments: Pt walked at a slow pace and was only able to complete one lap having complaints of mild SOB.     FENO:  No results found for: NITRICOXIDE  PFT: No flowsheet data found.  Imaging: Ct Chest High Resolution  Result Date: 10/02/2018 CLINICAL DATA:  Shortness of breath, wheezing EXAM: CT CHEST WITHOUT CONTRAST TECHNIQUE: Multidetector CT imaging of the chest was performed following the standard protocol without intravenous contrast. High resolution imaging of the lungs, as well as inspiratory and expiratory imaging, was performed. COMPARISON:  None. FINDINGS: Cardiovascular: No significant vascular findings. Normal heart size. No pericardial effusion. Mediastinum/Nodes: No enlarged mediastinal, hilar, or axillary lymph nodes. Although difficult to discretely visualize, there is an enlarged, partially calcified nodule of the inferior pole of the right lobe of the thyroid measuring at least 2.3 cm (series 2, image 9). Trachea, and esophagus demonstrate no significant findings. Lungs/Pleura: No evidence of fibrotic interstitial lung disease. No pleural effusion or pneumothorax. Upper Abdomen: No acute abnormality.  Gallstones. Musculoskeletal: No chest wall mass or suspicious bone lesions identified. IMPRESSION: 1. No evidence of fibrotic interstitial lung disease. No significant air trapping. 2. Although difficult to discretely visualize, there is an enlarged, partially calcified nodule of the inferior pole of the right lobe of the thyroid measuring at least 2.3 cm (series 2, image 9). Consider dedicated thyroid ultrasound to further evaluate. 3.  Cholelithiasis. Electronically Signed   By: Eddie Candle M.D.   On: 10/02/2018 16:21      Specialty Problems       Pulmonary Problems   Asthma   Dyspnea on exertion    09/26/2018- overnight oximetry test- 7 hours and 7 minutes were oxygen sats were above 90%, 1 minute were oxygen saturations were less than 89% and 11 seconds were oxygen saturations were less than 88%, patient did not qualify for oxygen  10/02/2018-CT chest high-res-no evidence of fibrotic interstitial lung disease, no significant air trapping, difficult to discretely visualize there is an enlarged partially partially calcified nodule in the inferior pole of the right lobe of the thyroid measuring least 2.3 cm, consider dedicated thyroid ultrasound to further evaluate  10/20/2016-echocardiogram- LV ejection fraction 60 to 12%, grade 1 diastolic dysfunction, PA P pressure of 30         Allergies  Allergen Reactions  . Codeine Other (See Comments)    HEADACHE  . Dextromethorphan Hbr Other (See Comments)    unspecified  . Lisinopril Cough  . Paroxetine Hcl Other (See Comments)    unspecified    Immunization History  Administered Date(s) Administered  . Tdap 09/24/2009    Past Medical History:  Diagnosis Date  . Anxiety   . Depression   . GERD (gastroesophageal reflux disease)   . History of asthma    no meds. in 2 yr.  . Hypertension    under control with med., has been on med. x 3-4 yr.  . Lipoma of back 10/2012  . Osteoarthritis   . Pneumonia 04/2014  . PONV (postoperative nausea and vomiting)   . Stress incontinence     Tobacco History: Social History   Tobacco Use  Smoking Status Never Smoker  Smokeless Tobacco Never Used   Counseling given: Yes   Continue to not smoke  Outpatient Encounter Medications as of 10/05/2018  Medication Sig  . amiodarone (PACERONE) 200 MG tablet Take 1 tablet (200 mg total) by mouth daily.  . benzonatate (TESSALON) 200 MG capsule Take 200 mg by mouth 2 (two) times daily as needed for cough.   Marland Kitchen CALCIUM & MAGNESIUM CARBONATES PO Take 2 capsules by mouth daily.  . furosemide  (LASIX) 40 MG tablet Take 1 tablet (40 mg total) by mouth daily.  . Melatonin 5 MG TABS Take 5 mg by mouth every evening.  . metoprolol succinate (TOPROL-XL) 50 MG 24 hr tablet TAKE 1 TABLET BY MOUTH EVERY DAY WITH OR IMMEDIATELY AFTER MEAL.  Marland Kitchen NUTRITIONAL SUPPLEMENTS PO Take 1 Dose by mouth daily. Serpeptase Enzymes   . omeprazole (PRILOSEC) 20 MG capsule Take by mouth.  . valsartan (DIOVAN) 160 MG tablet Take 2 tablets (320 mg total) by mouth daily.  Marland Kitchen venlafaxine XR (EFFEXOR-XR) 150 MG 24 hr capsule Take 150 mg by mouth daily.   Alveda Reasons 20 MG TABS tablet TAKE 1 TABLET (20 MG TOTAL) BY MOUTH DAILY WITH SUPPER.   No facility-administered encounter medications on file as of 10/05/2018.      Review of Systems  Review of Systems  Constitutional: Positive for fatigue. Negative for chills, fever and unexpected weight change.  HENT: Negative for postnasal drip, sinus pressure and sinus pain.   Respiratory: Positive for shortness of breath. Negative for cough, chest tightness and wheezing.   Cardiovascular: Positive for palpitations (baseline afib). Negative for chest pain.  Gastrointestinal: Negative for blood in stool, diarrhea, nausea and vomiting.  Genitourinary: Negative for dysuria, frequency and urgency.  Musculoskeletal: Negative for arthralgias.  Skin: Negative for color change.  Allergic/Immunologic: Negative for environmental allergies and food allergies.  Neurological: Negative for dizziness, light-headedness and headaches.  Psychiatric/Behavioral: Negative for dysphoric mood. The patient is not nervous/anxious.   All other systems reviewed and are negative.    Physical Exam  BP 122/64   Pulse (!) 48   Temp 97.9 F (36.6 C)   Ht 5' (1.524 m)   Wt 193 lb (87.5 kg)   LMP 09/11/2002   SpO2 96%   BMI 37.69 kg/m   Wt Readings from Last 5 Encounters:  10/05/18 193 lb (87.5 kg)  07/05/18 192 lb 9.6 oz (87.4 kg)  06/06/18 181 lb 6.4 oz (82.3 kg)  04/28/18 199 lb (90.3  kg)  05/12/17 210 lb 9.6 oz (95.5 kg)     Physical Exam  Constitutional: She is oriented to person, place, and time and well-developed, well-nourished, and in no distress. No distress.  Chronically ill adult female  HENT:  Head: Normocephalic and atraumatic.  Right Ear: Hearing, tympanic membrane, external ear and ear canal normal.  Left Ear: Hearing, tympanic membrane, external ear and ear canal normal.  Nose: Nose normal. Right sinus exhibits no maxillary sinus tenderness and no frontal sinus tenderness. Left sinus exhibits no maxillary sinus tenderness and no frontal sinus tenderness.  Mouth/Throat: Uvula is midline and oropharynx is clear and moist.  Eyes: Pupils are equal, round, and reactive to light.  Neck: Normal range of motion. Neck supple.  Cardiovascular: Normal heart sounds. A regularly irregular rhythm present. Bradycardia present.  Pulmonary/Chest: Effort normal and breath sounds normal. No accessory muscle usage. No respiratory distress. She has no decreased breath sounds. She has no wheezes. She has no rhonchi. She has no rales.  Abdominal: Soft. Bowel sounds are normal. She exhibits no distension. There is no abdominal tenderness.  Musculoskeletal: Normal range of motion.        General: No edema.  Lymphadenopathy:    She has no cervical adenopathy.  Neurological: She is alert and oriented to person, place, and time. Gait normal.  Skin: Skin is warm and dry. She is not diaphoretic. No erythema.  Psychiatric: Mood, memory, affect and judgment normal.  Nursing note and vitals reviewed.     Lab Results:  CBC    Component Value Date/Time   WBC 7.2 06/06/2018 1435   RBC 4.47 06/06/2018 1435   HGB 13.7 06/06/2018 1435   HCT 41.3 06/06/2018 1435   PLT 295 06/06/2018 1435   MCV 92 06/06/2018 1435   MCH 30.6 06/06/2018 1435   MCHC 33.2 06/06/2018 1435   RDW 13.2 06/06/2018 1435    BMET    Component Value Date/Time   NA 138 06/06/2018 1435   K 4.5 06/06/2018  1435   CL 98 06/06/2018 1435   CO2 22 06/06/2018 1435   GLUCOSE 91 06/06/2018 1435   GLUCOSE 113 (H) 11/10/2012 1500   BUN 13 06/06/2018 1435   CREATININE 0.67 06/06/2018 1435   CALCIUM 9.0 06/06/2018 1435   GFRNONAA 90 06/06/2018 1435   GFRAA 104 06/06/2018 1435    BNP No results found for: BNP  ProBNP No results found for: PROBNP    Assessment & Plan:   Abnormal findings on diagnostic imaging of lung Plan: Patient to follow-up with primary care regarding need for thyroid ultrasound Results of CT chest routed to primary care for follow-up  Dyspnea on exertion Assessment: Elevated pulmonary pressures on echocardiogram in 2018 High-resolution CT chest in June/2020  shows no ILD Ono in June/2020 shows no need for oxygen therapy Patient is unwilling to get COVID testing ordered to have pulmonary function testing performed  Discussion: Patient likely has some aspect of restrictive lung disease due to BMI of 37.6.  Unfortunately there is no way to fully assess this as patient is unwilling to receive COVID-19 testing per our current guidelines to receive a pulmonary function test.  Also no way to perform a cardiopulmonary stress test due to the same restrictions of the patient will need to be tested negative for COVID-19.  Patient is unwilling to do so.  Plan: Patient to continue to work on increasing physical exercise Patient to keep follow-up with primary care, cardiology as well as our office If patient changes her stance on receiving COVID-19 testing that we can proceed forward with a pulmonary function test as well as a cardiopulmonary stress test   Morbid obesity with BMI of 40.0-44.9, adult ([place]) Plan: Actively work on reducing BMI and achieving a healthy weight   PAF (paroxysmal atrial fibrillation) ([place]) Plan: Keep follow-up with cardiology    Return in about 6 months (around 04/06/2019), or if symptoms worsen or fail to improve, for Follow up with Dr.  Purnell Shoemaker.   Lauraine Rinne, NP 10/05/2018   This appointment was 29 minutes long with over 50% of the time in direct face-to-face patient care, assessment, plan of care, and follow-up.

## 2018-10-05 NOTE — Assessment & Plan Note (Signed)
Plan: Patient to follow-up with primary care regarding need for thyroid ultrasound Results of CT chest routed to primary care for follow-up

## 2018-10-05 NOTE — Assessment & Plan Note (Signed)
Plan: Keep follow-up with cardiology 

## 2018-10-05 NOTE — Assessment & Plan Note (Signed)
Assessment: Elevated pulmonary pressures on echocardiogram in 2018 High-resolution CT chest in June/2020 shows no ILD Ono in June/2020 shows no need for oxygen therapy Patient is unwilling to get COVID testing ordered to have pulmonary function testing performed  Discussion: Patient likely has some aspect of restrictive lung disease due to BMI of 37.6.  Unfortunately there is no way to fully assess this as patient is unwilling to receive COVID-19 testing per our current guidelines to receive a pulmonary function test.  Also no way to perform a cardiopulmonary stress test due to the same restrictions of the patient will need to be tested negative for COVID-19.  Patient is unwilling to do so.  Plan: Patient to continue to work on increasing physical exercise Patient to keep follow-up with primary care, cardiology as well as our office If patient changes her stance on receiving COVID-19 testing that we can proceed forward with a pulmonary function test as well as a cardiopulmonary stress test

## 2018-10-06 NOTE — Telephone Encounter (Signed)
Called PCP let them know it was routed / faxed over for them to keep an eye out for this fax and let Dr. Redmond Pulling know to of the reading. Office states they put in a message to let Dr. Redmond Pulling and staff know to keep an eye out for results / fax.  Nothing further needed at this time

## 2018-10-09 DIAGNOSIS — K8020 Calculus of gallbladder without cholecystitis without obstruction: Secondary | ICD-10-CM | POA: Insufficient documentation

## 2018-10-09 DIAGNOSIS — E041 Nontoxic single thyroid nodule: Secondary | ICD-10-CM | POA: Insufficient documentation

## 2018-10-25 ENCOUNTER — Other Ambulatory Visit: Payer: Self-pay | Admitting: Family Medicine

## 2018-10-25 DIAGNOSIS — E041 Nontoxic single thyroid nodule: Secondary | ICD-10-CM

## 2018-10-26 ENCOUNTER — Other Ambulatory Visit: Payer: Medicare Other

## 2018-11-06 ENCOUNTER — Other Ambulatory Visit: Payer: Self-pay | Admitting: Internal Medicine

## 2018-11-07 ENCOUNTER — Ambulatory Visit
Admission: RE | Admit: 2018-11-07 | Discharge: 2018-11-07 | Disposition: A | Discharge status: Home / Self Care | Payer: Medicare Other | Source: Ambulatory Visit | Attending: Family Medicine | Admitting: Family Medicine

## 2018-11-07 ENCOUNTER — Other Ambulatory Visit: Payer: Self-pay | Admitting: Interventional Cardiology

## 2018-11-07 DIAGNOSIS — E041 Nontoxic single thyroid nodule: Secondary | ICD-10-CM

## 2018-11-07 NOTE — Telephone Encounter (Signed)
Xarelto 20mg  refill request received; pt is 67 yrs old,wt-87.5kg, Crea-0.67 on 06/06/2018, last seen by Estella Husk on 06/06/2018, Diagnosis Afib, CrCl-107.70ml/min; will send in refill.

## 2019-04-19 ENCOUNTER — Other Ambulatory Visit: Payer: Self-pay | Admitting: Interventional Cardiology

## 2019-04-21 ENCOUNTER — Other Ambulatory Visit: Payer: Self-pay | Admitting: Interventional Cardiology

## 2019-05-03 ENCOUNTER — Ambulatory Visit: Payer: BC Managed Care – PPO | Admitting: Certified Nurse Midwife

## 2019-05-04 ENCOUNTER — Ambulatory Visit (INDEPENDENT_AMBULATORY_CARE_PROVIDER_SITE_OTHER): Payer: Medicare Other | Admitting: Certified Nurse Midwife

## 2019-05-04 ENCOUNTER — Encounter: Payer: Self-pay | Admitting: Certified Nurse Midwife

## 2019-05-04 ENCOUNTER — Other Ambulatory Visit: Payer: Self-pay

## 2019-05-04 VITALS — BP 118/80 | HR 70 | Temp 98.4°F | Resp 16 | Ht 59.75 in | Wt 189.0 lb

## 2019-05-04 DIAGNOSIS — N952 Postmenopausal atrophic vaginitis: Secondary | ICD-10-CM

## 2019-05-04 DIAGNOSIS — Z124 Encounter for screening for malignant neoplasm of cervix: Secondary | ICD-10-CM | POA: Diagnosis not present

## 2019-05-04 DIAGNOSIS — Z01419 Encounter for gynecological examination (general) (routine) without abnormal findings: Secondary | ICD-10-CM | POA: Diagnosis not present

## 2019-05-04 NOTE — Progress Notes (Signed)
69 y.o. G0P0 Divorced  Caucasian Fe here for annual exam.Post menopausal denies vaginal bleeding, just vaginal dryness, coconut oil working well. Sees PCP Dr. Redmond Pulling every 6 months for Hypertension and Effexor. Seeing cardiology also for other medication management. Still having respiratory issues with SOB, but under evaluation with specialists now. No other health issues. Does SBE, but refuses to have mammogram due to pain with exam and radiation exposure. Has had thermography in past.   Patient's last menstrual period was 09/11/2002.          Sexually active: No.  The current method of family planning is post menopausal status.    Exercising: No.  exercise Smoker:  no  Review of Systems  Constitutional: Negative.   HENT: Negative.   Eyes: Negative.   Respiratory: Negative.   Cardiovascular: Negative.   Gastrointestinal: Negative.   Genitourinary: Negative.   Musculoskeletal: Negative.   Skin: Negative.   Neurological: Negative.   Endo/Heme/Allergies: Negative.   Psychiatric/Behavioral: Negative.     Health Maintenance: Pap:  04-23-15 neg, 04-28-2018 neg HPV HR neg History of Abnormal Pap: no MMG:  2015 neg per patient Self Breast exams: yes Colonoscopy:  2010 f/u 78yrs BMD:   2015 TDaP:  2011, declined Shingles: declines Pneumonia: declines Hep C and HIV: hep c neg 2017 Labs: with PCP.   reports that she has never smoked. She has never used smokeless tobacco. She reports current alcohol use. She reports that she does not use drugs.  Past Medical History:  Diagnosis Date  . Anxiety   . Depression   . GERD (gastroesophageal reflux disease)   . History of asthma    no meds. in 2 yr.  . Hypertension    under control with med., has been on med. x 3-4 yr.  . Lipoma of back 10/2012  . Osteoarthritis   . Pneumonia 04/2014  . PONV (postoperative nausea and vomiting)   . Stress incontinence     Past Surgical History:  Procedure Laterality Date  . APPENDECTOMY    . KNEE  ARTHROPLASTY Right 2009  . LIPOMA EXCISION Right 11/13/2012   Procedure: EXCISION LIPOMA;  Surgeon: Pedro Earls, MD;  Location: [facility];  Service: General;  Laterality: Right;  . TONSILLECTOMY     age 59    Current Outpatient Medications  Medication Sig Dispense Refill  . amiodarone (PACERONE) 200 MG tablet Take 1 tablet (200 mg total) by mouth daily. 90 tablet 3  . benzonatate (TESSALON) 200 MG capsule Take 200 mg by mouth 2 (two) times daily as needed for cough.   4  . CALCIUM & MAGNESIUM CARBONATES PO Take 2 capsules by mouth daily.    . furosemide (LASIX) 40 MG tablet Take 1 tablet (40 mg total) by mouth daily. 90 tablet 3  . Melatonin 5 MG TABS Take 5 mg by mouth every evening.    . metoprolol succinate (TOPROL-XL) 50 MG 24 hr tablet Take 1 tablet (50 mg total) by mouth daily. With or immediately following a meal. Please keep upcoming appt in March with Dr. Irish Lack before anymore refills. Thank you 90 tablet 0  . NUTRITIONAL SUPPLEMENTS PO Take 1 Dose by mouth daily. Serpeptase Enzymes     . omeprazole (PRILOSEC) 20 MG capsule Take by mouth.    . valsartan (DIOVAN) 160 MG tablet Take 2 tablets (320 mg total) by mouth daily. Please keep upcoming appt in March with Dr. Irish Lack before anymore refills. Thank you 60 tablet 2  . venlafaxine  XR (EFFEXOR-XR) 150 MG 24 hr capsule Take 150 mg by mouth daily.     Alveda Reasons 20 MG TABS tablet TAKE 1 TABLET (20 MG TOTAL) BY MOUTH DAILY WITH SUPPER. 90 tablet 2   No current facility-administered medications for this visit.    Family History  Problem Relation Age of Onset  . Lung cancer Mother        and ETOH abuse  . Lung cancer Father        and ETOH abuse  . Throat cancer Brother        ETOH abuse    ROS:  Pertinent items are noted in HPI.  Otherwise, a comprehensive ROS was negative.  Exam:   BP 118/80   Pulse 70   Temp 98.4 F (36.9 C) (Skin)   Resp 16   Ht 4' 11.75" (1.518 m)   Wt 189 lb (85.7 kg)   LMP  09/11/2002   BMI 37.22 kg/m  Height: 4' 11.75" (151.8 cm) Ht Readings from Last 3 Encounters:  05/04/19 4' 11.75" (1.518 m)  10/05/18 5' (1.524 m)  07/05/18 5' (1.524 m)    General appearance: alert, cooperative and appears stated age Head: Normocephalic, without obvious abnormality, atraumatic Neck: no adenopathy, supple, symmetrical, trachea midline and thyroid normal to inspection and palpation Lungs: clear to auscultation bilaterally Breasts: normal appearance, no masses or tenderness, No nipple retraction or dimpling, No nipple discharge or bleeding, No axillary or supraclavicular adenopathy Heart: regular rate and rhythm Abdomen: soft, non-tender; no masses,  no organomegaly Extremities: extremities normal, atraumatic, no cyanosis or edema Skin: Skin color, texture, turgor normal. No rashes or lesions Lymph nodes: Cervical, supraclavicular, and axillary nodes normal. No abnormal inguinal nodes palpated Neurologic: Grossly normal   Pelvic: External genitalia:  no lesions              Urethra:  normal appearing urethra with no masses, tenderness or lesions              Bartholin's and Skene's: normal                 Vagina: normal appearing vagina with normal color and discharge, no lesions              Cervix: no cervical motion tenderness, no lesions and normal appearance              Pap taken: No. Bimanual Exam:  Uterus:  normal size, contour, position, consistency, mobility, non-tender and anteflexed              Adnexa: normal adnexa and no mass, fullness, tenderness               Rectovaginal: Confirms               Anus:  normal sphincter tone, no lesions  Chaperone present: yes  A:  Well Woman with normal exam  Post menopausal no HRT  Mammogram and BMD ( history of osteopenia) due  Respiratory issues continue under specialist now  Sees PCP for hypertension, anxiety/depression management, labs every 6 months.  Colonoscopy due    P:   Reviewed health and  wellness pertinent to exam  Aware of need to advise if vaginal bleeding.  Discussed benefit of mammogram and BMD for early detection and treatment. Offered to schedule for patient. Declines both and plans not ever again.  Continue follow up with MD as indicated.  Discussed colonoscopy due, patient will work with PCP regarding. Declines referral. Warning  signs given with blood in stool or stool change.  Pap smear: no   counseled on breast self exam, mammography screening, feminine hygiene, adequate intake of calcium and vitamin D, diet and exercise  return annually or prn  An After Visit Summary was printed and given to the patient.

## 2019-05-04 NOTE — Patient Instructions (Signed)

## 2019-05-18 ENCOUNTER — Other Ambulatory Visit: Payer: Self-pay | Admitting: Family Medicine

## 2019-05-18 DIAGNOSIS — E041 Nontoxic single thyroid nodule: Secondary | ICD-10-CM

## 2019-05-24 ENCOUNTER — Ambulatory Visit
Admission: RE | Admit: 2019-05-24 | Discharge: 2019-05-24 | Disposition: A | Discharge status: Home / Self Care | Payer: Medicare Other | Source: Ambulatory Visit | Attending: Family Medicine | Admitting: Family Medicine

## 2019-05-24 ENCOUNTER — Other Ambulatory Visit (HOSPITAL_COMMUNITY)
Admission: RE | Admit: 2019-05-24 | Discharge: 2019-05-24 | Disposition: A | Discharge status: Home / Self Care | Payer: Medicare Other | Source: Ambulatory Visit | Attending: Radiology | Admitting: Radiology

## 2019-05-24 DIAGNOSIS — E041 Nontoxic single thyroid nodule: Secondary | ICD-10-CM | POA: Insufficient documentation

## 2019-05-27 LAB — CYTOLOGY - NON PAP

## 2019-06-10 NOTE — Progress Notes (Signed)
Cardiology Office Note   Date:  06/12/2019   ID:  Stacey Caldwell, DOB [birth year], MRN [id number]  PCP:  Stacey Sacramento, MD    No chief complaint on file.  Atrial fibrillation  Wt Readings from Last 3 Encounters:  06/12/19 188 lb 9.6 oz (85.5 kg)  05/04/19 189 lb (85.7 kg)  10/05/18 193 lb (87.5 kg)       History of Present Illness: Stacey Caldwell is a 69 y.o. female  Who was diagnosed with AFib. She noticed DOE in April 2017 while walking to her office. She saw her PMD, Dr. Redmond Caldwell. There was no mention of an irregular heart beat.   DOE was worse in November. SHe went back to Dr. Redmond Caldwell. Tachycardia was detected and she was found to be in AFib. She was treated with Toprol but no blood thinner. She had bleeding in her rectum, and then was started on Amiodarone.   She was sent to Stacey Caldwell office. She saw Dr. Woody Caldwell initially. Xarelto was started. Echo and nuclear stress were done. Echo showed EF 35-40%. She was told she was in heart failure and needed a cardioversion. EF by nuclear stress test was 15%. She was thought to have a nonischemic cardiomyopathy.  Repeat EF in July of 2018 showed:Overall normal LV function and valvular function.  Decreased amiodarone to 200 mg daily in July 2018.  Planned to decrease Toprol in 1/19 if NSR was maintained.  Occasional anxiety.  Exercise has been mostly riding the bike. She does that 2x/week.  No problems with that activity.   In 2020, she was seen by Stacey Caldwell.  His records show: "Says her pulse is running low 50's and having trouble getting her balance when she first gets up.No dizziness HR only gets fast if she forgets her meds. Goes to the gym twice a week for 1 hr. Works with a Clinical research associate. Does a little bit of everything. Still has chronic dyspnea on exertion that hasn't improved with treatment of her Afib.  Used to be on inhalers but they did not help."  She had a thyroid biopsy, and it was negative for  malignancy.   Since the last visit, she has had rare episodes of palpitations.  Episodes can last a few hours and they resolve spontaneously.  Typically occurs at night so she lies down.  No trips to ER.  She is now riding her bike 3x/week.    Denies : Chest pain. Dizziness. Leg edema. Nitroglycerin use. Orthopnea. Palpitations. Paroxysmal nocturnal dyspnea. Shortness of breath with exertion. Syncope.   Stamina is improving.  She saw  Pulmonary MD as well, but she was unable to do this because she did not want to do a COVID test. She does stay out of crowds.    She works at [employer] but can maintain distance.    She has had some rare rectal bleeding.  No anemia.   Past Medical History:  Diagnosis Date  . A-fib ([place])   . Anxiety   . Depression   . GERD (gastroesophageal reflux disease)   . History of asthma    no meds. in 2 yr.  . Hypertension    under control with med., has been on med. x 3-4 yr.  . Lipoma of back 10/2012  . Osteoarthritis   . Pneumonia 04/2014  . PONV (postoperative nausea and vomiting)   . Stress incontinence     Past Surgical History:  Procedure Laterality Date  . APPENDECTOMY    .  KNEE ARTHROPLASTY Right 2009  . LIPOMA EXCISION Right 11/13/2012   Procedure: EXCISION LIPOMA;  Surgeon: Stacey Earls, MD;  Location: [facility];  Service: General;  Laterality: Right;  . TONSILLECTOMY     age 7     Current Outpatient Medications  Medication Sig Dispense Refill  . amiodarone (PACERONE) 200 MG tablet Take 1 tablet (200 mg total) by mouth daily. 90 tablet 3  . benzonatate (TESSALON) 200 MG capsule Take 200 mg by mouth 2 (two) times daily as needed for cough.   4  . CALCIUM & MAGNESIUM CARBONATES PO Take 2 capsules by mouth daily.    . furosemide (LASIX) 40 MG tablet Take 1 tablet (40 mg total) by mouth daily. 90 tablet 3  . Melatonin 5 MG TABS Take 5 mg by mouth every evening.    . metoprolol succinate (TOPROL-XL) 50 MG 24 hr tablet Take 1  tablet (50 mg total) by mouth daily. With or immediately following a meal. Please keep upcoming appt in March with Dr. Irish Lack before anymore refills. Thank you 90 tablet 0  . NUTRITIONAL SUPPLEMENTS PO Take 1 Dose by mouth daily. Serpeptase Enzymes     . omeprazole (PRILOSEC) 20 MG capsule Take by mouth.    . valsartan (DIOVAN) 160 MG tablet Take 2 tablets (320 mg total) by mouth daily. Please keep upcoming appt in March with Dr. Irish Lack before anymore refills. Thank you 60 tablet 2  . venlafaxine XR (EFFEXOR-XR) 150 MG 24 hr capsule Take 150 mg by mouth daily.     Stacey Caldwell 20 MG TABS tablet TAKE 1 TABLET (20 MG TOTAL) BY MOUTH DAILY WITH SUPPER. 90 tablet 2   No current facility-administered medications for this visit.    Allergies:   Codeine, Dextromethorphan hbr, Lisinopril, and Paroxetine hcl    Social History:  The patient  reports that she has never smoked. She has never used smokeless tobacco. She reports current alcohol use. She reports that she does not use drugs.   Family History:  The patient's family history includes Lung cancer in her father and mother; Throat cancer in her brother.    ROS:  Please see the history of present illness.   Otherwise, review of systems are positive for chronic DOE-improving.  Doing keto diet- no recent weight loss.  All other systems are reviewed and negative.    PHYSICAL EXAM: VS:  BP 130/68   Pulse (!) 53   Ht 5' (1.524 m)   Wt 188 lb 9.6 oz (85.5 kg)   LMP 09/11/2002   SpO2 97%   BMI 36.83 kg/m  , BMI Body mass index is 36.83 kg/m. GEN: Well nourished, well developed, in no acute distress  HEENT: normal  Neck: no JVD, carotid bruits, or masses Cardiac: RRR; no murmurs, rubs, or gallops,no edema  Respiratory:  clear to auscultation bilaterally, normal work of breathing GI: soft, nontender, nondistended, + BS MS: no deformity or atrophy  Skin: warm and dry, no rash Neuro:  Strength and sensation are intact Psych: euthymic mood,  full affect   EKG:   The ekg ordered today demonstrates NSR, No ST changes   Recent Labs: No results found for requested labs within last 8760 hours.   Lipid Panel No results found for: CHOL, TRIG, HDL, CHOLHDL, VLDL, LDLCALC, LDLDIRECT   Other studies Reviewed: Additional studies/ records that were reviewed today with results demonstrating: 2020 labs reviewed.     ASSESSMENT AND PLAN:  1. AFib: On amiodarone.  Needs LFTs and TSH checked every 6 months.  She should have routine chest x-rays as well.  It has been several years since she had a chest x-ray.  Will schedule routine chest x-ray.  Oxygen satsnormal at home 2. Bradycardia: No sx of dizziness. No syncope.  Tolerating Toprol.   3. Anticoagulated: Continue Xarelto.  Check CBC.  Rare rectal bleeding. 4. Hypertensive heart disease: The current medical regimen is effective;  continue present plan and medications.   Current medicines are reviewed at length with the patient today.  The patient concerns regarding her medicines were addressed.  The following changes have been made:  No change  Labs/ tests ordered today include:  No orders of the defined types were placed in this encounter.   Recommend 150 minutes/week of aerobic exercise Low fat, low carb, high fiber diet recommended  Disposition:   FU in 1 year   Signed, Larae Grooms, MD  06/12/2019 1:40 PM    [facility] [address], [place], [place]  [address] Phone: [phone]; Fax: [phone]

## 2019-06-12 ENCOUNTER — Ambulatory Visit (INDEPENDENT_AMBULATORY_CARE_PROVIDER_SITE_OTHER): Payer: Medicare Other | Admitting: Interventional Cardiology

## 2019-06-12 ENCOUNTER — Encounter: Payer: Self-pay | Admitting: Interventional Cardiology

## 2019-06-12 ENCOUNTER — Other Ambulatory Visit: Payer: Self-pay

## 2019-06-12 VITALS — BP 130/68 | HR 53 | Ht 60.0 in | Wt 188.6 lb

## 2019-06-12 DIAGNOSIS — I480 Paroxysmal atrial fibrillation: Secondary | ICD-10-CM | POA: Diagnosis not present

## 2019-06-12 DIAGNOSIS — I10 Essential (primary) hypertension: Secondary | ICD-10-CM

## 2019-06-12 DIAGNOSIS — I420 Dilated cardiomyopathy: Secondary | ICD-10-CM

## 2019-06-12 DIAGNOSIS — Z6841 Body Mass Index (BMI) 40.0 and over, adult: Secondary | ICD-10-CM

## 2019-06-12 DIAGNOSIS — Z79899 Other long term (current) drug therapy: Secondary | ICD-10-CM

## 2019-06-12 NOTE — Patient Instructions (Signed)
Medication Instructions:  Your physician recommends that you continue on your current medications as directed. Please refer to the Current Medication list given to you today.  *If you need a refill on your cardiac medications before your next appointment, please call your pharmacy*   Lab Work: Your physician recommends that you return for a FASTING LIPIDS, CMET, CBC, and TSH  If you have labs (blood work) drawn today and your tests are completely normal, you will receive your results only by: Marland Kitchen MyChart Message (if you have MyChart) OR . A paper copy in the mail If you have any lab test that is abnormal or we need to change your treatment, we will call you to review the results.   Testing/Procedures: None ordered   Follow-Up: At [facility], you and your health needs are our priority.  As part of our continuing mission to provide you with exceptional heart care, we have created designated Provider Care Teams.  These Care Teams include your primary Cardiologist (physician) and Advanced Practice Providers (APPs -  Physician Assistants and Nurse Practitioners) who all work together to provide you with the care you need, when you need it.  We recommend signing up for the patient portal called "MyChart".  Sign up information is provided on this After Visit Summary.  MyChart is used to connect with patients for Virtual Visits (Telemedicine).  Patients are able to view lab/test results, encounter notes, upcoming appointments, etc.  Non-urgent messages can be sent to your provider as well.   To learn more about what you can do with MyChart, go to [email or web address].    Your next appointment:   12 month(s)  The format for your next appointment:   In Person  Provider:   You may see Larae Grooms, MD or one of the following Advanced Practice Providers on your designated Care Team:    Melina Copa, PA-C  Ermalinda Barrios, PA-C    Other Instructions

## 2019-06-18 ENCOUNTER — Other Ambulatory Visit: Payer: Self-pay

## 2019-06-18 ENCOUNTER — Other Ambulatory Visit: Payer: Medicare Other | Admitting: *Deleted

## 2019-06-18 DIAGNOSIS — I10 Essential (primary) hypertension: Secondary | ICD-10-CM

## 2019-06-18 DIAGNOSIS — Z79899 Other long term (current) drug therapy: Secondary | ICD-10-CM

## 2019-06-18 DIAGNOSIS — Z6841 Body Mass Index (BMI) 40.0 and over, adult: Secondary | ICD-10-CM

## 2019-06-18 DIAGNOSIS — I480 Paroxysmal atrial fibrillation: Secondary | ICD-10-CM

## 2019-06-18 DIAGNOSIS — I420 Dilated cardiomyopathy: Secondary | ICD-10-CM

## 2019-06-19 LAB — CBC
Hematocrit: 39 % (ref 34.0–46.6)
Hemoglobin: 13.6 g/dL (ref 11.1–15.9)
MCH: 31.9 pg (ref 26.6–33.0)
MCHC: 34.9 g/dL (ref 31.5–35.7)
MCV: 91 fL (ref 79–97)
Platelets: 260 10*3/uL (ref 150–450)
RBC: 4.27 x10E6/uL (ref 3.77–5.28)
RDW: 12.9 % (ref 11.7–15.4)
WBC: 6.1 10*3/uL (ref 3.4–10.8)

## 2019-06-19 LAB — COMPREHENSIVE METABOLIC PANEL
ALT: 10 IU/L (ref 0–32)
AST: 20 IU/L (ref 0–40)
Albumin/Globulin Ratio: 2.2 (ref 1.2–2.2)
Albumin: 4.2 g/dL (ref 3.8–4.8)
Alkaline Phosphatase: 64 IU/L (ref 39–117)
BUN/Creatinine Ratio: 16 (ref 12–28)
BUN: 12 mg/dL (ref 8–27)
Bilirubin Total: 0.3 mg/dL (ref 0.0–1.2)
CO2: 23 mmol/L (ref 20–29)
Calcium: 8.8 mg/dL (ref 8.7–10.3)
Chloride: 100 mmol/L (ref 96–106)
Creatinine, Ser: 0.77 mg/dL (ref 0.57–1.00)
GFR calc Af Amer: 90 mL/min/{1.73_m2} (ref >59)
GFR calc non Af Amer: 78 mL/min/{1.73_m2} (ref >59)
Globulin, Total: 1.9 g/dL (ref 1.5–4.5)
Glucose: 107 mg/dL — ABNORMAL HIGH (ref 65–99)
Potassium: 4.6 mmol/L (ref 3.5–5.2)
Sodium: 138 mmol/L (ref 134–144)
Total Protein: 6.1 g/dL (ref 6.0–8.5)

## 2019-06-19 LAB — LIPID PANEL
Chol/HDL Ratio: 2.4 ratio (ref 0.0–4.4)
Cholesterol, Total: 203 mg/dL — ABNORMAL HIGH (ref 100–199)
HDL: 83 mg/dL (ref >39)
LDL Chol Calc (NIH): 107 mg/dL — ABNORMAL HIGH (ref 0–99)
Triglycerides: 73 mg/dL (ref 0–149)
VLDL Cholesterol Cal: 13 mg/dL (ref 5–40)

## 2019-06-19 LAB — TSH: TSH: 1.52 u[IU]/mL (ref 0.450–4.500)

## 2019-07-03 ENCOUNTER — Encounter: Payer: Self-pay | Admitting: Certified Nurse Midwife

## 2019-07-14 ENCOUNTER — Other Ambulatory Visit: Payer: Self-pay | Admitting: Interventional Cardiology

## 2019-07-21 ENCOUNTER — Other Ambulatory Visit: Payer: Self-pay | Admitting: Interventional Cardiology

## 2019-07-23 NOTE — Telephone Encounter (Signed)
Pt last saw Dr Irish Lack 06/12/19, last labs 06/18/19 Creat 0.77, age 69, weight 85.5, CrCl 91.76, based on CrCl pt is on appropriate dosage of Xarelto 20mg  QD.  Will refill rx.

## 2019-12-15 IMAGING — US US THYROID
1 series · 13 of 25 positions shown · non-contrast
Comparison: Prior CT scan of the chest 10/02/2018

CLINICAL DATA: Incidental on CT.

EXAM:
THYROID ULTRASOUND
TECHNIQUE: Ultrasound examination of the thyroid gland and adjacent soft
tissues was performed.

[Series 1: us thyroid · 0.07mm/px · 54 acquisitions, 13 frames shown]
[im 1/54]
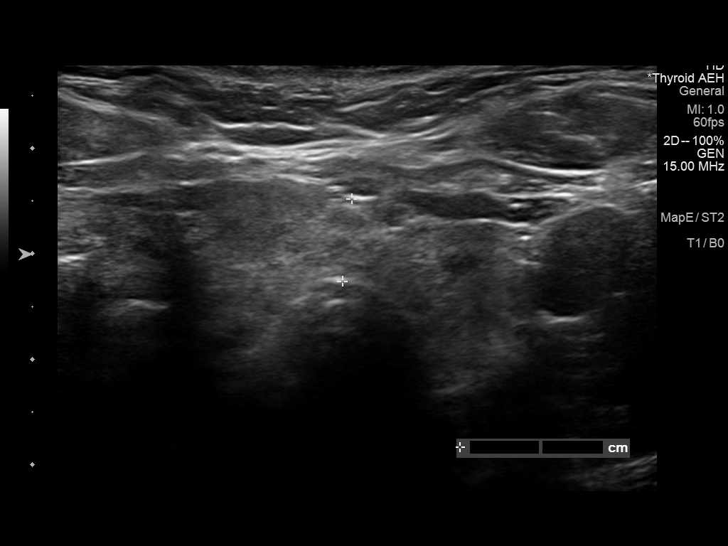
[im 5/54]
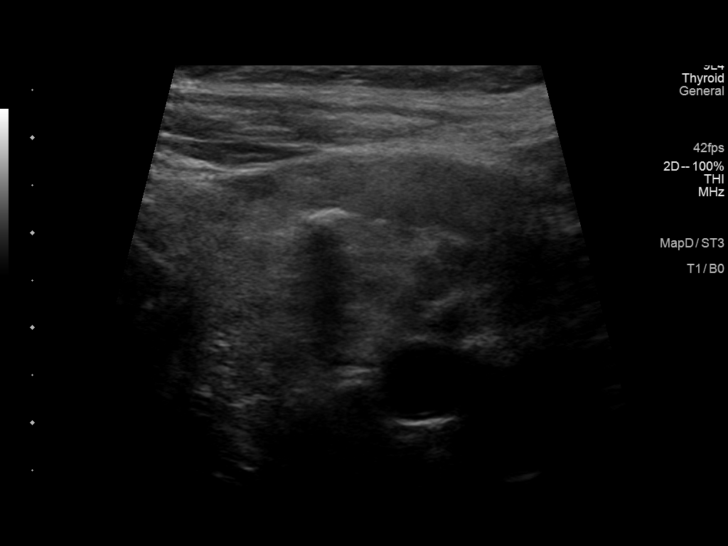
[im 9/54]
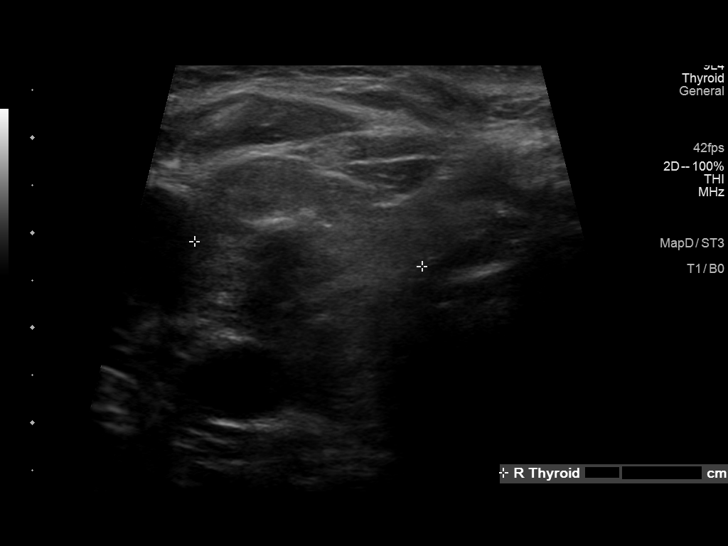
[im 14/54]
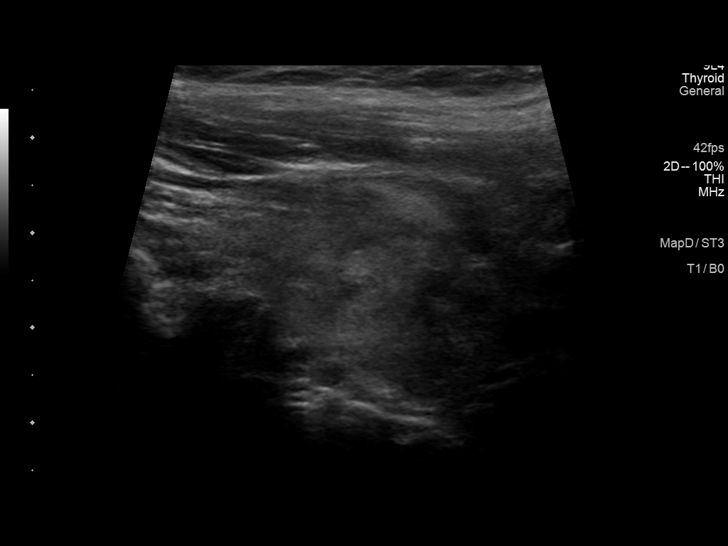
[im 18/54]
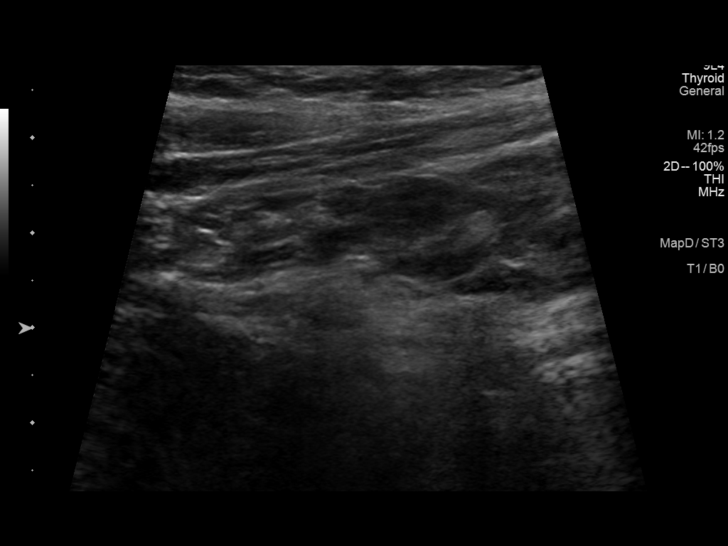
[im 23/54]
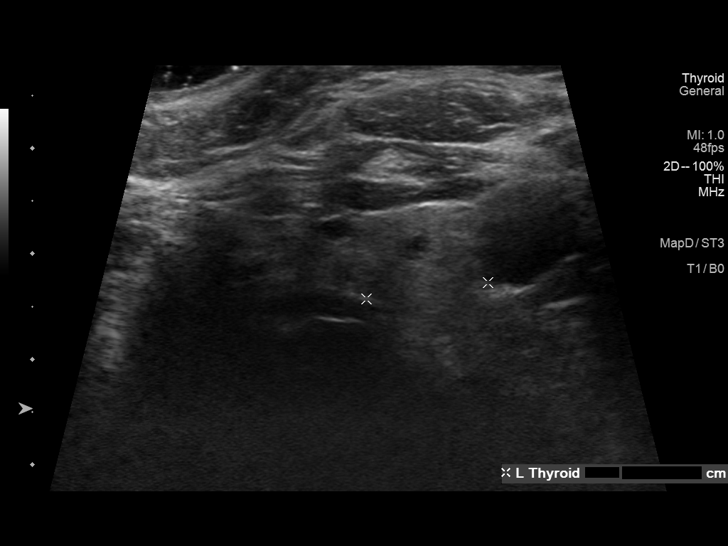
[im 27/54]
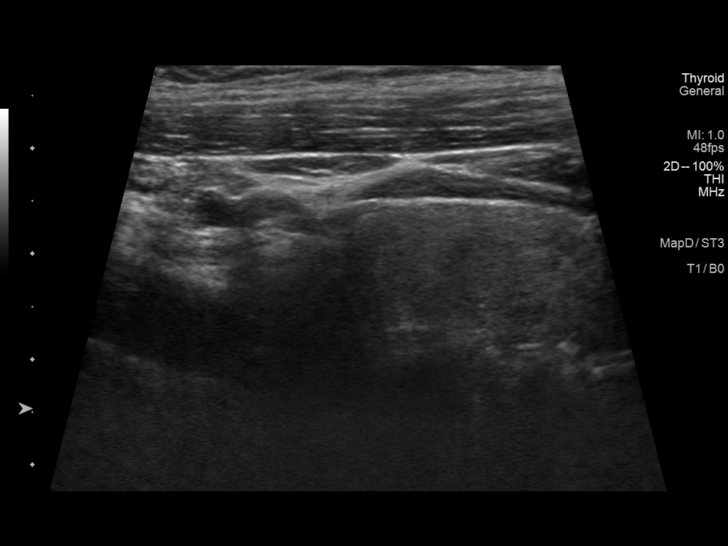
[im 31/54]
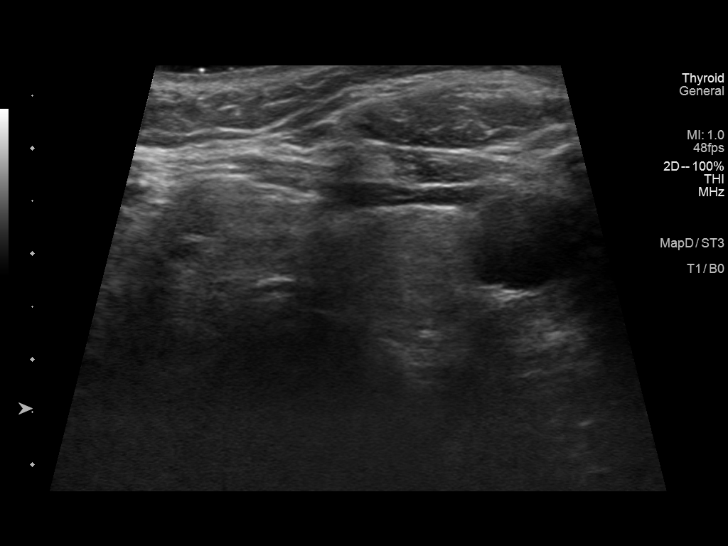
[im 36/54]
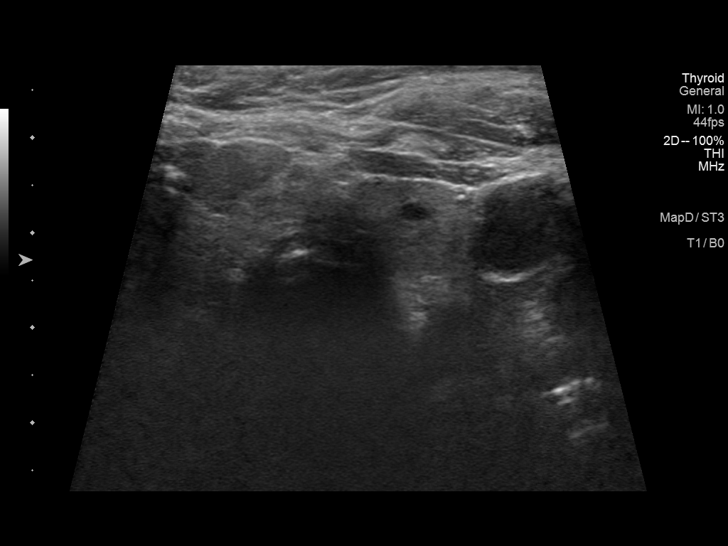
[im 40/54]
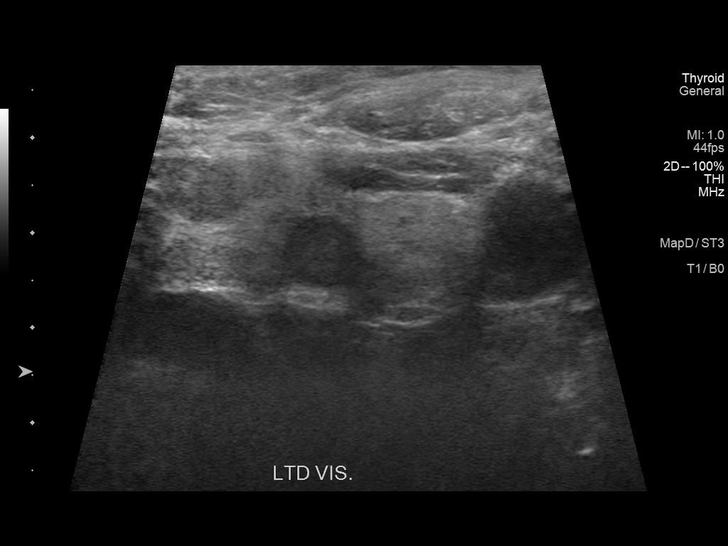
[im 45/54]
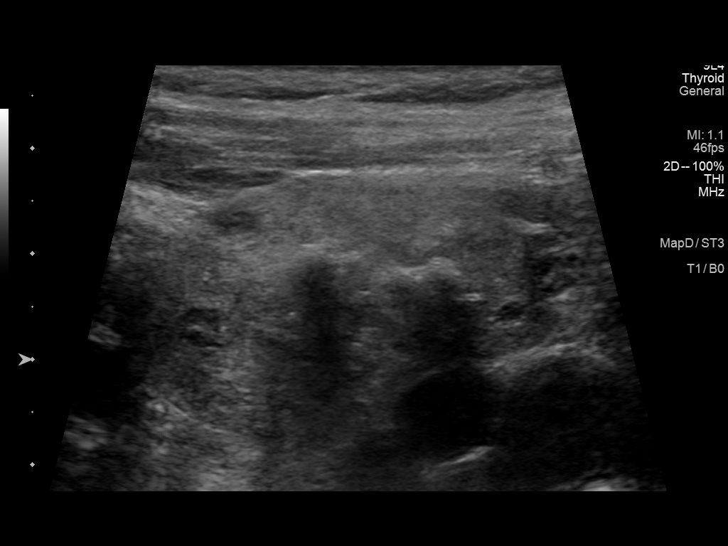
[im 49/54]
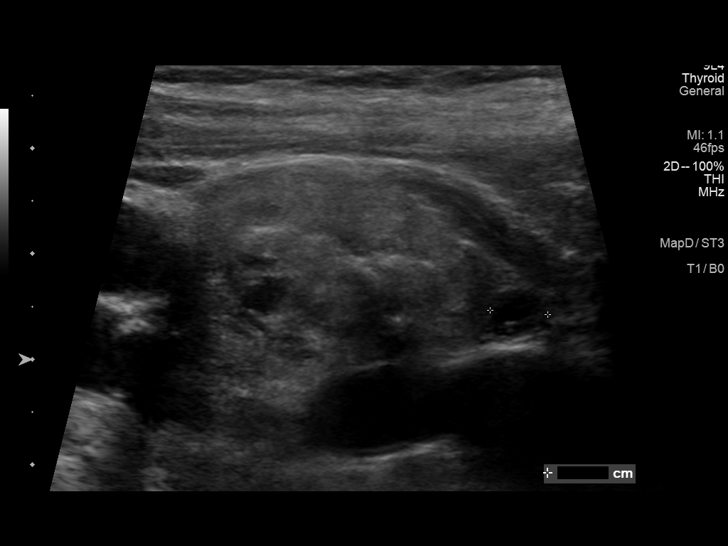
[im 54/54]
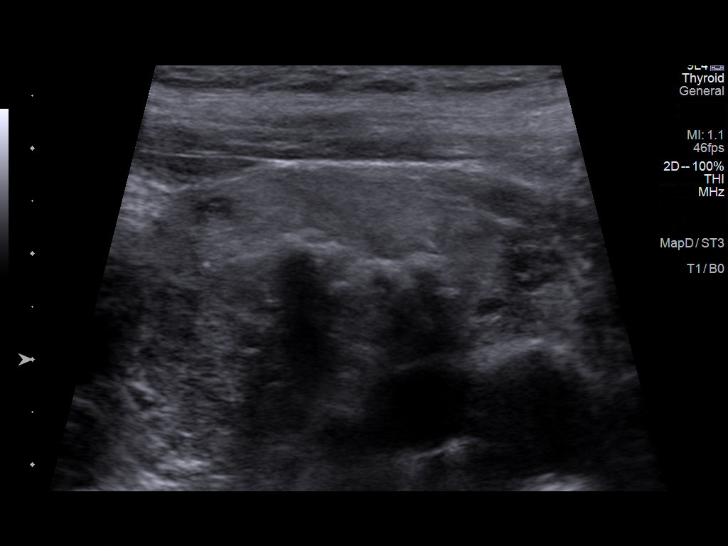

[13 of 25 positions shown; findings below may reference images not displayed]

FINDINGS: Parenchymal Echotexture: Markedly heterogenous

Isthmus: 0.8 cm

Right lobe: 4.3 x 2.5 x 2.4 cm

Left lobe: 3.2 x 1.4 x 1.2 cm

_________________________________________________________

Estimated total number of nodules >/= 1 cm: 1

Number of spongiform nodules >/=  2 cm not described below (TR1): 0

Number of mixed cystic and solid nodules >/= 1.5 cm not described
below (TR2): 0

_________________________________________________________

Nodule # 2:

Location: Right; Mid

Maximum size: 4.0 cm; Other 2 dimensions: 1.6 x 1.6 cm

Composition: solid/almost completely solid (2)

Echogenicity: isoechoic (1)

Shape: not taller-than-wide (0)

Margins: ill-defined (0)

Echogenic foci: macrocalcifications (1)

ACR TI-RADS total points: 4.

ACR TI-RADS risk category: TR4 (4-6 points).

ACR TI-RADS recommendations:

**Given size (>/= 1.5 cm) and appearance, fine needle aspiration of
this moderately suspicious nodule should be considered based on
TI-RADS criteria.

_________________________________________________________

Multiple additional small subcentimeter nodules are present in the
inferior right gland and thyroid isthmus. These nodules do not meet
criteria for further evaluation.
IMPRESSION: 1. Heterogeneous TI-RADS category 4 nodule occupying the right mid
gland measuring approximately 4.0 cm. This lesion meets criteria for
consideration of fine-needle aspiration biopsy.
2. Generalized thyromegaly with diffuse heterogeneity in additional
small subcentimeter nodules which do not require further evaluation.

The above is in keeping with the ACR TI-RADS recommendations - [HOSPITAL] 9966;[DATE].

## 2020-05-05 ENCOUNTER — Other Ambulatory Visit: Payer: Self-pay | Admitting: Interventional Cardiology

## 2020-05-05 NOTE — Telephone Encounter (Signed)
Prescription refill request for Xarelto received.   Indication: Afib Last office visit: 06/12/2019, Irish Lack Weight: 85.5 kg  Age: 70 yo  Scr: 0.77, 06/18/2019 CrCl: 90 ml/min   Prescription refill sent.

## 2020-05-12 NOTE — Progress Notes (Signed)
70 y.o. G0P0 Divorced White or Caucasian female here for breast & pelvic.     Not interested in mammogram, "does more harm than good with radiation" Denies vaginal bleeding. Denies problems today with breast or pelvic area. States she uses coconut oil for vaginal dryness when needed.  Wears pads daily for stress incontinence, does not want to do anything about that at this time.  Has two degrees from [employer] and one from [place]. Still works 9 hours per week, does budget for [employer]  Patient's last menstrual period was 09/11/2002.          Sexually active: No.  The current method of family planning is post menopausal status.    Exercising: No.  exercise Smoker:  no  Health Maintenance: Pap:  04-28-2018 neg HPV HR neg History of abnormal Pap:  no MMG:  2015 neg per patient Colonoscopy:  2010 f/u 87yrs BMD:   2015 TDaP:  2011, declined Gardasil:   n/a Covid-19: not done Hep C testing: neg 2017   reports that she has never smoked. She has never used smokeless tobacco. She reports current alcohol use. She reports that she does not use drugs.  Past Medical History:  Diagnosis Date  . A-fib ([place])   . Anxiety   . Depression   . GERD (gastroesophageal reflux disease)   . History of asthma    no meds. in 2 yr.  . Hypertension    under control with med., has been on med. x 3-4 yr.  . Lipoma of back 10/2012  . Osteoarthritis   . Pneumonia 04/2014  . PONV (postoperative nausea and vomiting)   . Stress incontinence     Past Surgical History:  Procedure Laterality Date  . APPENDECTOMY    . KNEE ARTHROPLASTY Right 2009  . LIPOMA EXCISION Right 11/13/2012   Procedure: EXCISION LIPOMA;  Surgeon: Pedro Earls, MD;  Location: [facility];  Service: General;  Laterality: Right;  . TONSILLECTOMY     age 40    Current Outpatient Medications  Medication Sig Dispense Refill  . amiodarone (PACERONE) 200 MG tablet TAKE 1 TABLET BY MOUTH EVERY DAY 90 tablet 3  . benzonatate  (TESSALON) 200 MG capsule Take 200 mg by mouth 2 (two) times daily as needed for cough.   4  . CALCIUM & MAGNESIUM CARBONATES PO Take 2 capsules by mouth daily.    . furosemide (LASIX) 40 MG tablet TAKE 1 TABLET BY MOUTH EVERY DAY 90 tablet 3  . Melatonin 5 MG TABS Take 5 mg by mouth every evening.    . metoprolol succinate (TOPROL-XL) 50 MG 24 hr tablet Take 1 tablet (50 mg total) by mouth daily. With or immediately following a meal. 90 tablet 3  . NUTRITIONAL SUPPLEMENTS PO Take 1 Dose by mouth daily. Serpeptase Enzymes    . valsartan (DIOVAN) 160 MG tablet TAKE 2 TABLETS BY MOUTH EVERY DAY 180 tablet 3  . XARELTO 20 MG TABS tablet TAKE 1 TABLET (20 MG TOTAL) BY MOUTH DAILY WITH SUPPER. 90 tablet 1   No current facility-administered medications for this visit.    Family History  Problem Relation Age of Onset  . Lung cancer Mother        and ETOH abuse  . Lung cancer Father        and ETOH abuse  . Throat cancer Brother        ETOH abuse    Review of Systems  Constitutional: Negative.   HENT:  Negative.   Eyes: Negative.   Respiratory: Negative.   Cardiovascular: Negative.   Gastrointestinal: Negative.   Endocrine: Negative.   Genitourinary: Negative.   Musculoskeletal: Negative.   Skin: Negative.   Allergic/Immunologic: Negative.   Neurological: Negative.   Hematological: Negative.   Psychiatric/Behavioral: Negative.     Exam:   BP 118/64   Pulse 60   Resp 16   Ht 5' 0.25" (1.53 m)   Wt 187 lb (84.8 kg)   LMP 09/11/2002   BMI 36.22 kg/m   Height: 5' 0.25" (153 cm)  General appearance: alert, cooperative and appears stated age, pt appears to be short of breath, states she is like this when she gets hot and feels hot in exam room. Appropriate in thought process and answers to questions.  Breasts: No axillary or supraclavicular adenopathy, Normal to palpation without dominant masses  Pelvic: External genitalia:  no lesions              Urethra:  normal appearing  urethra with no masses, tenderness or lesions              Bartholins and Skenes: normal                 Vagina: normal appearing vagina, appropriate for age, small amount of vaginal discharge, no lesions              Cervix: neg cervical motion tenderness, no visible lesions             Bimanual Exam:   Uterus:  normal size, contour, position, consistency, mobility, non-tender              Adnexa: no mass, fullness, tenderness   Rectal: no palpable mass, able to assess uterus more completely, no enlargement                 Joy, CMA Chaperone was present for exam.  A:  Well Woman with normal exam  Breast and Pelvic screening only  P:   Pap : due 2025, discussed discontinuing, pt states she would like to continue  Mammogram:Pt declines, has not had one since 2015  DEXA: Hx osteopenia, pt does not want DEXA at this time  Labs: with PCP  Medications: no new  F/u 1 year, discussed with out mammogram, recommend breast exam every year

## 2020-05-15 ENCOUNTER — Ambulatory Visit (INDEPENDENT_AMBULATORY_CARE_PROVIDER_SITE_OTHER): Payer: Medicare Other | Admitting: Nurse Practitioner

## 2020-05-15 ENCOUNTER — Ambulatory Visit: Payer: Medicare Other | Admitting: Obstetrics & Gynecology

## 2020-05-15 ENCOUNTER — Other Ambulatory Visit: Payer: Self-pay

## 2020-05-15 ENCOUNTER — Encounter: Payer: Self-pay | Admitting: Nurse Practitioner

## 2020-05-15 VITALS — BP 118/64 | HR 60 | Resp 16 | Ht 60.25 in | Wt 187.0 lb

## 2020-05-15 DIAGNOSIS — Z01419 Encounter for gynecological examination (general) (routine) without abnormal findings: Secondary | ICD-10-CM | POA: Diagnosis not present

## 2020-05-15 NOTE — Patient Instructions (Signed)
Preventive Care 72 Years and Older, Female Preventive care refers to lifestyle choices and visits with your health care provider that can promote health and wellness. This includes:  A yearly physical exam. This is also called an annual wellness visit.  Regular dental and eye exams.  Immunizations.  Screening for certain conditions.  Healthy lifestyle choices, such as: ? Eating a healthy diet. ? Getting regular exercise. ? Not using drugs or products that contain nicotine and tobacco. ? Limiting alcohol use. What can I expect for my preventive care visit? Physical exam Your health care provider will check your:  Height and weight. These may be used to calculate your BMI (body mass index). BMI is a measurement that tells if you are at a healthy weight.  Heart rate and blood pressure.  Body temperature.  Skin for abnormal spots. Counseling Your health care provider may ask you questions about your:  Past medical problems.  Family's medical history.  Alcohol, tobacco, and drug use.  Emotional well-being.  Home life and relationship well-being.  Sexual activity.  Diet, exercise, and sleep habits.  History of falls.  Memory and ability to understand (cognition).  Work and work Statistician.  Pregnancy and menstrual history.  Access to firearms. What immunizations do I need? Vaccines are usually given at various ages, according to a schedule. Your health care provider will recommend vaccines for you based on your age, medical history, and lifestyle or other factors, such as travel or where you work.   What tests do I need? Blood tests  Lipid and cholesterol levels. These may be checked every 5 years, or more often depending on your overall health.  Hepatitis C test.  Hepatitis B test. Screening  Lung cancer screening. You may have this screening every year starting at age 72 if you have a 30-pack-year history of smoking and currently smoke or have quit within  the past 15 years.  Colorectal cancer screening. ? All adults should have this screening starting at age 72 and continuing until age 72. ? Your health care provider may recommend screening at age 72 if you are at increased risk. ? You will have tests every 1-10 years, depending on your results and the type of screening test.  Diabetes screening. ? This is done by checking your blood sugar (glucose) after you have not eaten for a while (fasting). ? You may have this done every 1-3 years.  Mammogram. ? This may be done every 1-2 years. ? Talk with your health care provider about how often you should have regular mammograms.  Abdominal aortic aneurysm (AAA) screening. You may need this if you are a current or former smoker.  BRCA-related cancer screening. This may be done if you have a family history of breast, ovarian, tubal, or peritoneal cancers. Other tests  STD (sexually transmitted disease) testing, if you are at risk.  Bone density scan. This is done to screen for osteoporosis. You may have this done starting at age 72. Talk with your health care provider about your test results, treatment options, and if necessary, the need for more tests. Follow these instructions at home: Eating and drinking  Eat a diet that includes fresh fruits and vegetables, whole grains, lean protein, and low-fat dairy products. Limit your intake of foods with high amounts of sugar, saturated fats, and salt.  Take vitamin and mineral supplements as recommended by your health care provider.  Do not drink alcohol if your health care provider tells you not to drink.  If you drink alcohol: ? Limit how much you have to 0-1 drink a day. ? Be aware of how much alcohol is in your drink. In the [place] one drink equals one 12 oz bottle of beer (355 mL), one 5 oz glass of wine (148 mL), or one 1 oz glass of hard liquor (44 mL).   Lifestyle  Take daily care of your teeth and gums. Brush your teeth every morning  and night with fluoride toothpaste. Floss one time each day.  Stay active. Exercise for at least 30 minutes 5 or more days each week.  Do not use any products that contain nicotine or tobacco, such as cigarettes, e-cigarettes, and chewing tobacco. If you need help quitting, ask your health care provider.  Do not use drugs.  If you are sexually active, practice safe sex. Use a condom or other form of protection in order to prevent STIs (sexually transmitted infections).  Talk with your health care provider about taking a low-dose aspirin or statin.  Find healthy ways to cope with stress, such as: ? Meditation, yoga, or listening to music. ? Journaling. ? Talking to a trusted person. ? Spending time with friends and family. Safety  Always wear your seat belt while driving or riding in a vehicle.  Do not drive: ? If you have been drinking alcohol. Do not ride with someone who has been drinking. ? When you are tired or distracted. ? While texting.  Wear a helmet and other protective equipment during sports activities.  If you have firearms in your house, make sure you follow all gun safety procedures. What's next?  Visit your health care provider once a year for an annual wellness visit.  Ask your health care provider how often you should have your eyes and teeth checked.  Stay up to date on all vaccines. This information is not intended to replace advice given to you by your health care provider. Make sure you discuss any questions you have with your health care provider. Document Revised: 03/19/2020 Document Reviewed: 03/23/2018 Elsevier Patient Education  2021 Elsevier Inc.  

## 2020-05-17 ENCOUNTER — Other Ambulatory Visit: Payer: Self-pay | Admitting: Interventional Cardiology

## 2020-06-17 ENCOUNTER — Ambulatory Visit: Payer: Medicare Other | Admitting: Certified Nurse Midwife

## 2020-06-23 ENCOUNTER — Ambulatory Visit: Payer: Medicare Other | Admitting: Interventional Cardiology

## 2020-06-23 NOTE — Progress Notes (Signed)
Cardiology Office Note   Date:  06/24/2020   ID:  Stacey Caldwell, DOB 23-Feb-1951, MRN [id number]  PCP:  Christain Sacramento, MD    No chief complaint on file.    Wt Readings from Last 3 Encounters:  06/24/20 192 lb 9.6 oz (87.4 kg)  05/15/20 187 lb (84.8 kg)  06/12/19 188 lb 9.6 oz (85.5 kg)       History of Present Illness: Stacey Caldwell is a 70 y.o. female   Who was diagnosed with AFib. She noticed DOE in April 2017 while walking to her office. She saw her PMD, Dr. Redmond Pulling. There was no mention of an irregular heart beat.   DOE was worse in November. SHe went back to Dr. Redmond Pulling. Tachycardia was detected and she was found to be in AFib. She was treated with Toprol but no blood thinner. She had bleeding in her rectum, and then was started on Amiodarone.   She was sent to Dr. Irven Shelling office. She saw Dr. Woody Seller initially. Xarelto was started. Echo and nuclear stress were done. Echo showed EF 35-40%. She was told she was in heart failure and needed a cardioversion. EF by nuclear stress test was 15%. She was thought to have a nonischemic cardiomyopathy.  Repeat EF in July of 2018 showed:Overall normal LV function and valvular function.  Decreased amiodarone to 200 mg daily in July 2018. Planned to decrease Toprol in 1/19 if NSR was maintained.  Occasional anxiety.  Exercise has been mostly riding the bike. She does that 2x/week. No problems with that activity.   In 2020, she was seen by Sharyn Lull.  His records show: "Says her pulse is running low 50's and having trouble getting her balance when she first gets up.No dizziness HR only gets fast if she forgets her meds. Goes to the gym twice a week for 1 hr. Works with a Clinical research associate. Does a little bit of everything. Still has chronic dyspnea on exertion that hasn't improved with treatment of her Afib.Used to be on inhalers but they did not help."  She had a thyroid biopsy, and it was negative for malignancy.    In 2020,  she had rare episodes of palpitations.  Episodes can last a few hours and they resolve spontaneously.  Typically occurs at night so she lies down.  No trips to ER.  She was riding her bike 3x/week.    Stamina was improving.  She saw  Pulmonary MD as well.   She had some rare rectal bleeding.  No anemia.     Past Medical History:  Diagnosis Date  . A-fib ([place])   . Anxiety   . Depression   . GERD (gastroesophageal reflux disease)   . History of asthma    no meds. in 2 yr.  . Hypertension    under control with med., has been on med. x 3-4 yr.  . Lipoma of back 10/2012  . Osteoarthritis   . Pneumonia 04/2014  . PONV (postoperative nausea and vomiting)   . Stress incontinence     Past Surgical History:  Procedure Laterality Date  . APPENDECTOMY    . KNEE ARTHROPLASTY Right 2009  . LIPOMA EXCISION Right 11/13/2012   Procedure: EXCISION LIPOMA;  Surgeon: Pedro Earls, MD;  Location: [facility];  Service: General;  Laterality: Right;  . TONSILLECTOMY     age 67     Current Outpatient Medications  Medication Sig Dispense Refill  . amiodarone (PACERONE) 200 MG  tablet TAKE 1 TABLET BY MOUTH EVERY DAY 90 tablet 3  . benzonatate (TESSALON) 200 MG capsule Take 200 mg by mouth 2 (two) times daily as needed for cough.   4  . CALCIUM & MAGNESIUM CARBONATES PO Take 2 capsules by mouth daily.    . furosemide (LASIX) 40 MG tablet TAKE 1 TABLET BY MOUTH EVERY DAY 90 tablet 3  . Melatonin 5 MG TABS Take 5 mg by mouth every evening.    . metoprolol succinate (TOPROL-XL) 50 MG 24 hr tablet TAKE 1 TABLET BY MOUTH DAILY WITH OR IMMEDIATELY FOLLOWING A MEAL. 90 tablet 3  . NUTRITIONAL SUPPLEMENTS PO Take 1 Dose by mouth daily. Serpeptase Enzymes    . valsartan (DIOVAN) 160 MG tablet TAKE 2 TABLETS BY MOUTH EVERY DAY 180 tablet 3  . venlafaxine XR (EFFEXOR-XR) 37.5 MG 24 hr capsule Take 1 capsule by mouth as directed.    Alveda Reasons 20 MG TABS tablet TAKE 1 TABLET (20  MG TOTAL) BY MOUTH DAILY WITH SUPPER. 90 tablet 1   No current facility-administered medications for this visit.    Allergies:   Codeine, Dextromethorphan hbr, Lisinopril, and Paroxetine hcl    Social History:  The patient  reports that she has never smoked. She has never used smokeless tobacco. She reports current alcohol use. She reports that she does not use drugs.   Family History:  The patient's family history includes Lung cancer in her father and mother; Throat cancer in her brother.    ROS:  Please see the history of present illness.   Otherwise, review of systems are positive for wheezing with allergies.   All other systems are reviewed and negative.    PHYSICAL EXAM: VS:  Ht 5\' 1"  (1.549 m)   Wt 192 lb 9.6 oz (87.4 kg)   LMP 09/11/2002   BMI 36.39 kg/m  , BMI Body mass index is 36.39 kg/m. GEN: Well nourished, well developed, in no acute distress  HEENT: normal  Neck: no JVD, carotid bruits, or masses Cardiac: RRR; no murmurs, rubs, or gallops,no edema  Respiratory:  wheezing to auscultation bilaterally, normal work of breathing, some upper airway rattling GI: soft, nontender, nondistended, + BS MS: no deformity or atrophy  Skin: warm and dry, no rash Neuro:  Strength and sensation are intact Psych: euthymic mood, full affect   EKG:   The ekg ordered today demonstrates sinus bradycardia, no ST changes   Recent Labs: No results found for requested labs within last 8760 hours.   Lipid Panel    Component Value Date/Time   CHOL 203 (H) 06/18/2019 1411   TRIG 73 06/18/2019 1411   HDL 83 06/18/2019 1411   CHOLHDL 2.4 06/18/2019 1411   LDLCALC 107 (H) 06/18/2019 1411     Other studies Reviewed: Additional studies/ records that were reviewed today with results demonstrating: LDL 107 in3/21.   ASSESSMENT AND PLAN:  1. AFib: In NSR.  On Amio to maintain NSR.  Plan for chest xray. Check CMet, TSH.  Given lung issues, may try to decrease Amiodarone to 100 mg  daily.   2. Cardiomyopathy resolved on f/u echo.  3. Bradycardia: Marked sinus bradycardia.  HR at home typically I the 46-53 range.  Decrease Toprol to 25 mg daily.  4. Anticoagulated: No bleeding issues.  5. Hypertensive heart disease: The current medical regimen is effective;  continue present plan and medications.  6. She should follow with pulmonary as well.     Current medicines are  reviewed at length with the patient today.  The patient concerns regarding her medicines were addressed.  The following changes have been made:  No change  Labs/ tests ordered today include:  No orders of the defined types were placed in this encounter.   Recommend 150 minutes/week of aerobic exercise Low fat, low carb, high fiber diet recommended  Disposition:   FU in 1 year   Signed, Larae Grooms, MD  06/24/2020 3:21 PM    [facility] [address], [place], [place]  [address] Phone: [phone]; Fax: [phone]

## 2020-06-24 ENCOUNTER — Other Ambulatory Visit: Payer: Self-pay

## 2020-06-24 ENCOUNTER — Encounter: Payer: Self-pay | Admitting: Interventional Cardiology

## 2020-06-24 ENCOUNTER — Ambulatory Visit (INDEPENDENT_AMBULATORY_CARE_PROVIDER_SITE_OTHER): Payer: Medicare Other | Admitting: Interventional Cardiology

## 2020-06-24 VITALS — BP 132/64 | HR 92 | Ht 61.0 in | Wt 192.6 lb

## 2020-06-24 DIAGNOSIS — I480 Paroxysmal atrial fibrillation: Secondary | ICD-10-CM

## 2020-06-24 DIAGNOSIS — I420 Dilated cardiomyopathy: Secondary | ICD-10-CM | POA: Diagnosis not present

## 2020-06-24 DIAGNOSIS — Z79899 Other long term (current) drug therapy: Secondary | ICD-10-CM

## 2020-06-24 DIAGNOSIS — Z6841 Body Mass Index (BMI) 40.0 and over, adult: Secondary | ICD-10-CM

## 2020-06-24 DIAGNOSIS — I10 Essential (primary) hypertension: Secondary | ICD-10-CM | POA: Diagnosis not present

## 2020-06-24 MED ORDER — METOPROLOL SUCCINATE ER 25 MG PO TB24: 25.0000 mg | ORAL_TABLET | Freq: Every day | ORAL | 3 refills | Status: DC

## 2020-06-24 MED ORDER — FUROSEMIDE 40 MG PO TABS: 40.0000 mg | ORAL_TABLET | Freq: Every day | ORAL | 3 refills | Status: DC

## 2020-06-24 MED ORDER — VALSARTAN 160 MG PO TABS: 320.0000 mg | ORAL_TABLET | Freq: Every day | ORAL | 3 refills | Status: DC

## 2020-06-24 NOTE — Patient Instructions (Signed)
Medication Instructions:  Your physician has recommended you make the following change in your medication: Decrease Metoprolol Succinate to 25 mg by mouth daily  *If you need a refill on your cardiac medications before your next appointment, please call your pharmacy*   Lab Work: Lab work to be done today--CMET and TSH If you have labs (blood work) drawn today and your tests are completely normal, you will receive your results only by: Marland Kitchen MyChart Message (if you have MyChart) OR . A paper copy in the mail If you have any lab test that is abnormal or we need to change your treatment, we will call you to review the results.   Testing/Procedures: A chest x-ray takes a picture of the organs and structures inside the chest, including the heart, lungs, and blood vessels. This test can show several things, including, whether the heart is enlarges; whether fluid is building up in the lungs; and whether pacemaker / defibrillator leads are still in place. Have done at [facility]  8 AM to 4:30 PM   Follow-Up: At [facility], you and your health needs are our priority.  As part of our continuing mission to provide you with exceptional heart care, we have created designated Provider Care Teams.  These Care Teams include your primary Cardiologist (physician) and Advanced Practice Providers (APPs -  Physician Assistants and Nurse Practitioners) who all work together to provide you with the care you need, when you need it.  We recommend signing up for the patient portal called "MyChart".  Sign up information is provided on this After Visit Summary.  MyChart is used to connect with patients for Virtual Visits (Telemedicine).  Patients are able to view lab/test results, encounter notes, upcoming appointments, etc.  Non-urgent messages can be sent to your provider as well.   To learn more about what you can do with MyChart, go to [email or web address].    Your next appointment:    12 month(s)  The format for your next appointment:   In Person  Provider:   You may see Larae Grooms, MD or one of the following Advanced Practice Providers on your designated Care Team:    Melina Copa, PA-C  Ermalinda Barrios, PA-C    Other Instructions

## 2020-06-25 LAB — COMPREHENSIVE METABOLIC PANEL
ALT: 8 IU/L (ref 0–32)
AST: 17 IU/L (ref 0–40)
Albumin/Globulin Ratio: 2.4 — ABNORMAL HIGH (ref 1.2–2.2)
Albumin: 4.3 g/dL (ref 3.7–4.7)
Alkaline Phosphatase: 65 IU/L (ref 44–121)
BUN/Creatinine Ratio: 20 (ref 12–28)
BUN: 15 mg/dL (ref 8–27)
Bilirubin Total: 0.3 mg/dL (ref 0.0–1.2)
CO2: 22 mmol/L (ref 20–29)
Calcium: 9.1 mg/dL (ref 8.7–10.3)
Chloride: 100 mmol/L (ref 96–106)
Creatinine, Ser: 0.76 mg/dL (ref 0.57–1.00)
Globulin, Total: 1.8 g/dL (ref 1.5–4.5)
Glucose: 84 mg/dL (ref 65–99)
Potassium: 4.6 mmol/L (ref 3.5–5.2)
Sodium: 139 mmol/L (ref 134–144)
Total Protein: 6.1 g/dL (ref 6.0–8.5)
eGFR: 84 mL/min/{1.73_m2} (ref >59)

## 2020-06-25 LAB — TSH: TSH: 1.18 u[IU]/mL (ref 0.450–4.500)

## 2020-06-30 IMAGING — US US FNA BIOPSY THYROID 1ST LESION
1 series · 13 of 19 positions shown · non-contrast
Comparison: Thyroid ultrasound dated 11/07/2018

MEDICATIONS:
None

COMPLICATIONS:
None immediate.

INDICATION: Patient with history of incidental thyroid nodule noted on CT chest
and follow-up thyroid ultrasound on 11/07/2018 which revealed a 4 cm
right mid nodule which meets criteria for biopsy. She presents today
for the procedure.

EXAM:
ULTRASOUND GUIDED FINE NEEDLE ASPIRATION BIOPSY OF RIGHT MID THYROID
NODULE
TECHNIQUE: Informed written consent was obtained from the patient after a
discussion of the risks, benefits and alternatives to treatment.
Questions regarding the procedure were encouraged and answered. A
timeout was performed prior to the initiation of the procedure.

[Series 1: us fna biopsy thyroid 1st lesion · 0.07mm/px · 19 acquisitions, 13 frames shown]
[im 1/19]
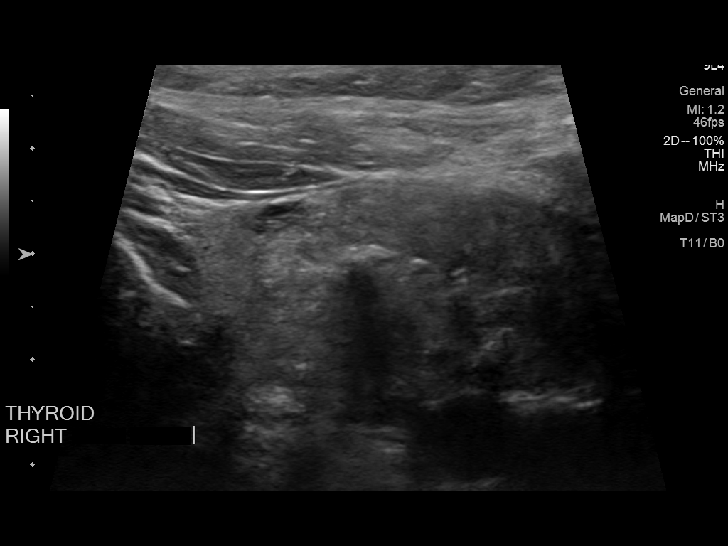
[im 3/19]
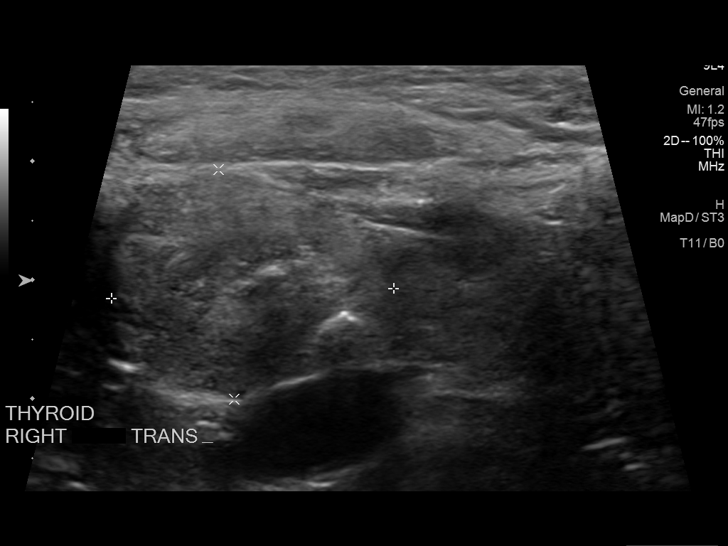
[im 4/19]
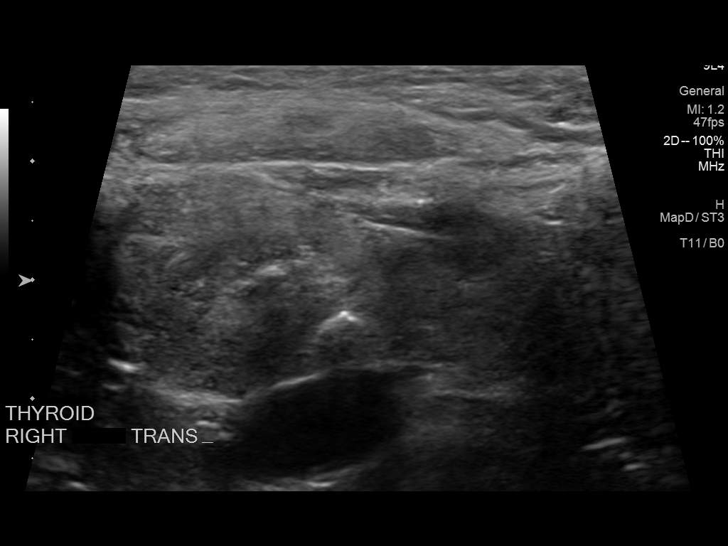
[im 6/19]
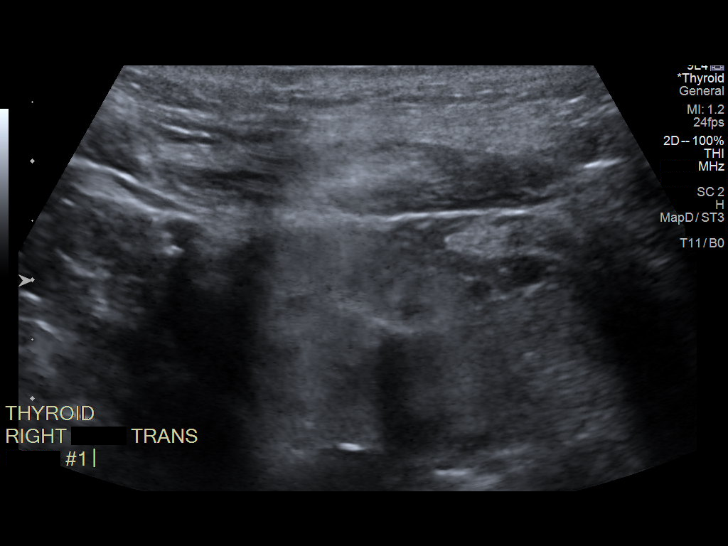
[im 7/19]
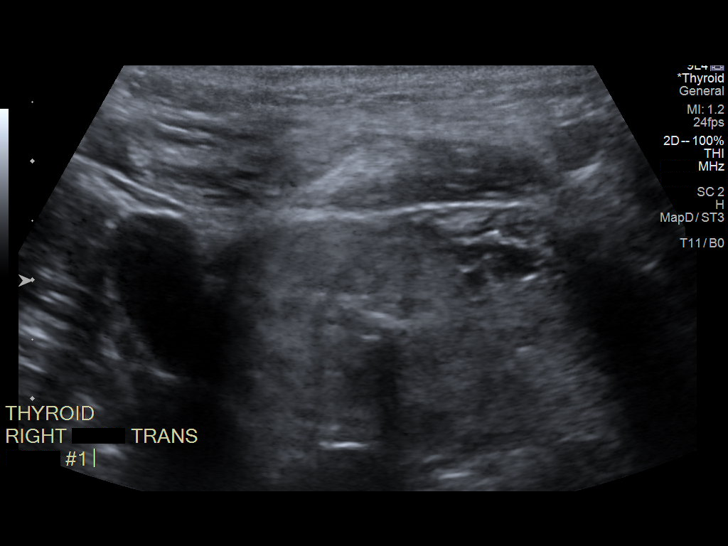
[im 9/19]
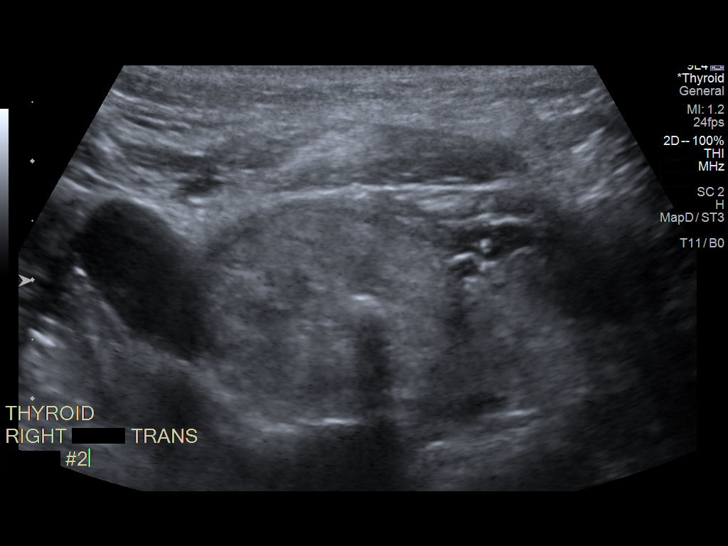
[im 10/19]
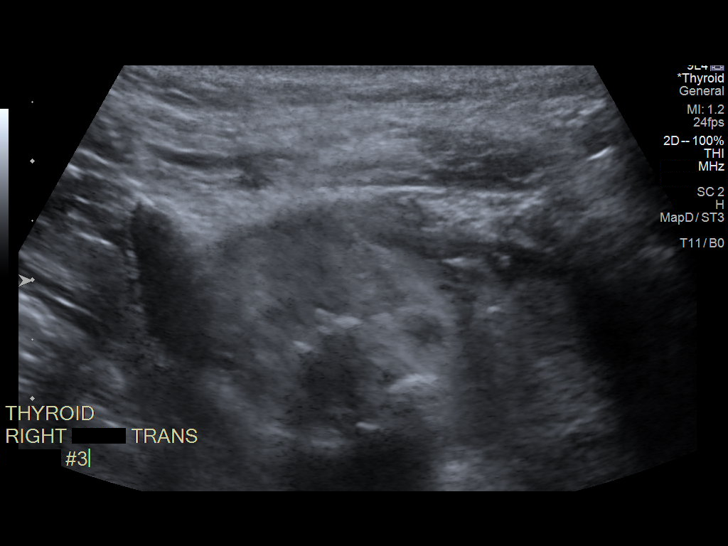
[im 11/19]
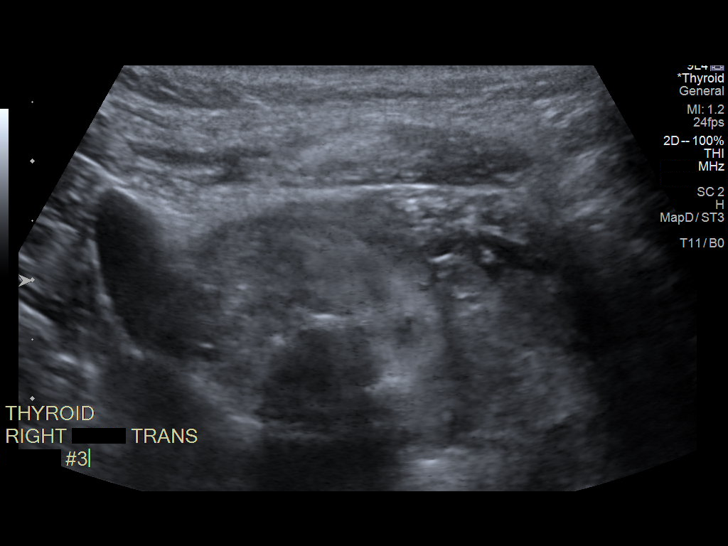
[im 13/19]
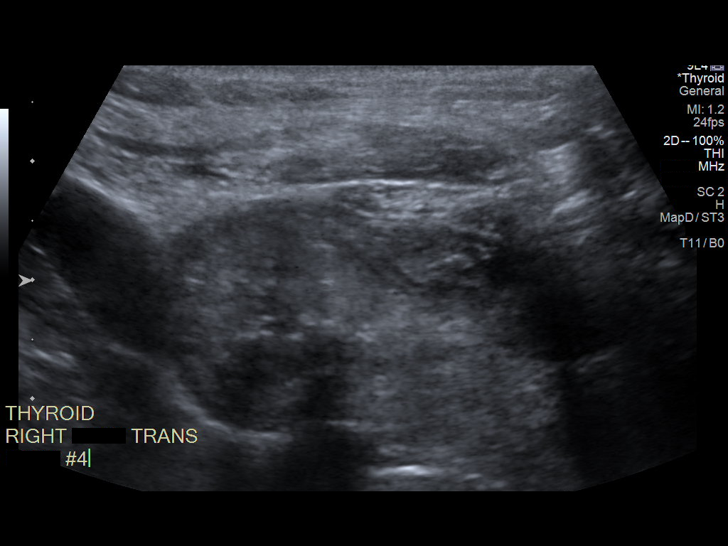
[im 14/19]
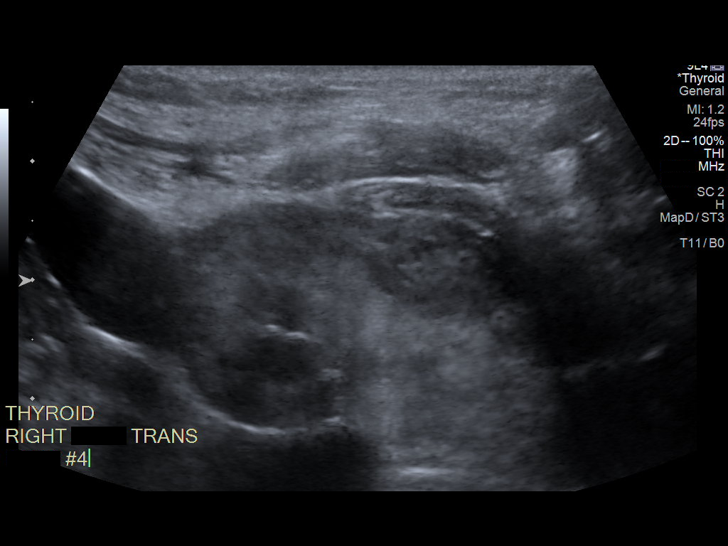
[im 16/19]
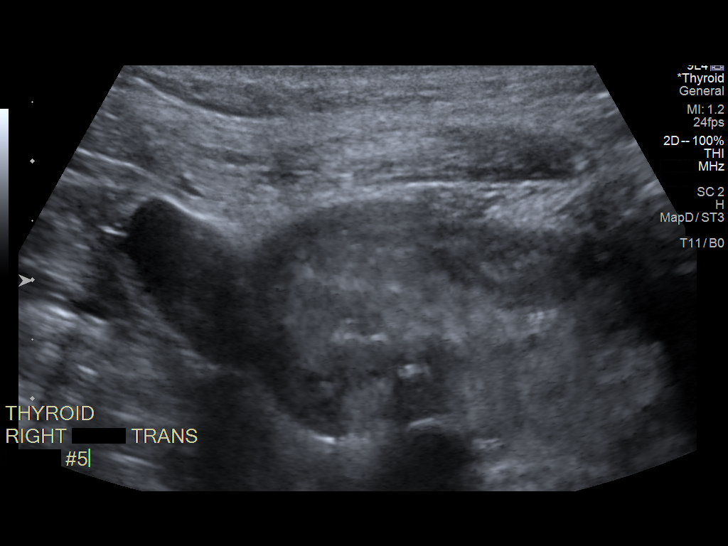
[im 17/19]
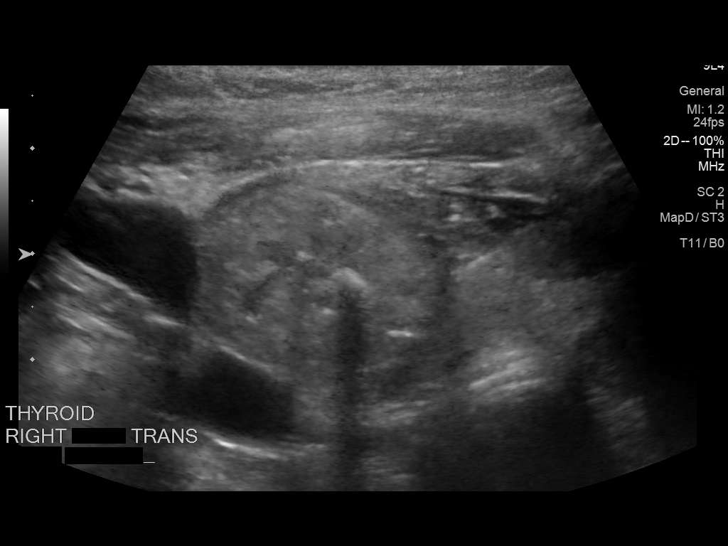
[im 19/19]
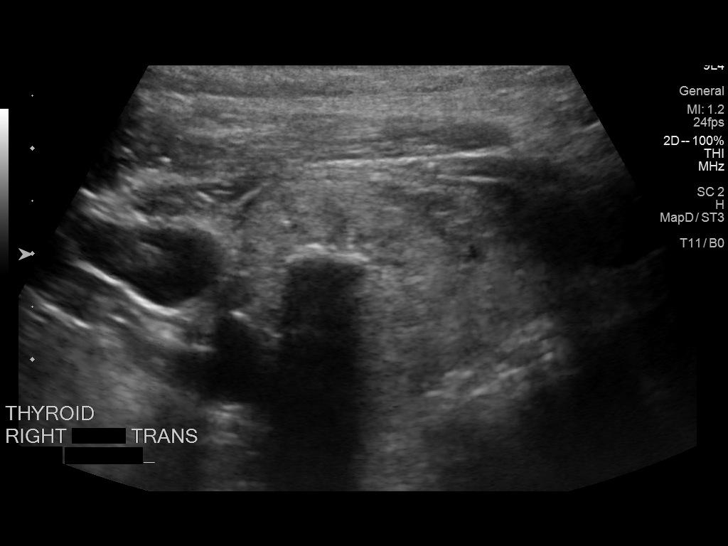

[13 of 19 positions shown; findings below may reference images not displayed]

Pre-procedural ultrasound scanning demonstrated unchanged size and
appearance of the indeterminate nodule within the right mid thyroid
lobe

The procedure was planned. The neck was prepped in the usual sterile
fashion, and a sterile drape was applied covering the operative
field. A timeout was performed prior to the initiation of the
procedure. Local anesthesia was provided with 1% lidocaine.

Under direct ultrasound guidance, 5 FNA biopsies were performed of
the right mid thyroid nodule with 25 gauge needles. Multiple
ultrasound images were saved for procedural documentation purposes.
The samples were prepared and submitted to pathology as well as for
Afirma testing.

Limited post procedural scanning was negative for hematoma or
additional complication. Dressings were placed. The patient
tolerated the above procedures procedure well without immediate
postprocedural complication.
FINDINGS: Nodule reference number based on prior diagnostic ultrasound: 2

Maximum size: 4.0 cm

Location: Right; Mid

ACR TI-RADS risk category: TR4 (4-6 points)

Reason for biopsy: meets ACR TI-RADS criteria

Ultrasound imaging confirms appropriate placement of the needles
within the thyroid nodule.
IMPRESSION: Technically successful ultrasound guided fine needle aspiration
biopsy of right mid thyroid nodule. Final pathology pending.

## 2020-08-01 ENCOUNTER — Other Ambulatory Visit: Payer: Self-pay | Admitting: Interventional Cardiology

## 2020-11-06 ENCOUNTER — Other Ambulatory Visit: Payer: Self-pay | Admitting: Interventional Cardiology

## 2020-11-06 NOTE — Telephone Encounter (Signed)
Prescription refill request for Xarelto received.  Indication: Afib Last office visit: 06/24/20 Stacey Lack, MD) Weight: 87.4kg Age: 70 Scr: 0.76 (06/24/20) CrCl: 93.35m/min  Refill sent to requested pharmacy.

## 2021-04-15 ENCOUNTER — Ambulatory Visit
Admission: RE | Admit: 2021-04-15 | Discharge: 2021-04-15 | Disposition: A | Discharge status: Home / Self Care | Payer: Medicare PPO | Source: Ambulatory Visit | Attending: Interventional Cardiology | Admitting: Interventional Cardiology

## 2021-04-15 ENCOUNTER — Other Ambulatory Visit: Payer: Self-pay

## 2021-04-15 DIAGNOSIS — I420 Dilated cardiomyopathy: Secondary | ICD-10-CM

## 2021-04-15 DIAGNOSIS — Z79899 Other long term (current) drug therapy: Secondary | ICD-10-CM

## 2021-04-15 DIAGNOSIS — I480 Paroxysmal atrial fibrillation: Secondary | ICD-10-CM

## 2021-07-06 ENCOUNTER — Other Ambulatory Visit: Payer: Self-pay | Admitting: Interventional Cardiology

## 2021-07-12 NOTE — Progress Notes (Signed)
?Cardiology Office Note:   ? ?Date:  07/13/2021  ? ?ID:  Stacey Caldwell, DOB 28-Oct-1950, MRN [id number] ? ?PCP:  Christain Sacramento, MD ?  ?[facility] Providers ?Cardiologist:  Larae Grooms, MD    ? ?Referring MD: Christain Sacramento, MD  ? ?Chief Complaint: annual follow-up PAF ? ?History of Present Illness:   ? ?Stacey Caldwell is a 71 y.o. female with a hx of hypertension, PAF, dilated cardiomyopathy, obesity, asthma, and current use of amiodarone. ? ?She established care with our group on 07/19/16 with Dr. Mitzi Davenport for a fib. She was referred to Dr. Woody Seller at [facility] and was started on Xarelto. Echo revealed LVEF 35-40%. She was told she was in heart failure and needed a cardioversion, thought to have NICM. She wanted another opinion and so came for an evaluation with our group. Symptom was DOE, no palpitations. Cardiomyopathy improved on follow-up echo 10/2016 to LVEF 60-65% ? ?She has maintained consistent follow-up and was last seen in our office on 06/24/20 by Dr. Irish Lack. Metoprolol was decreased due to bradycardia and screening CXR, TSH, and lfts were checked due to amiodarone therapy One year follow-up was recommended. Electrolytes, lfts and tsh were normal. Chest x-ray revealed a small nodule and follow-up CT was recommended. ? ?Today, she is here alone for follow-up.  She is wheezing and has difficulty completing a full sentence. Is using a handheld fan because cool air makes her feel better. States these symptoms occurred over the last few months. Has called pulmonology for an appointment. She denies difficulty swallowing or heartburn. Has history of esophageal spasm that required dilation many years ago.  ?Reports she has a lot of mucus for which she takes for decongestant pills at night.  She is putting colloidal silver in her nebulizer and is using a machine that provides  ?Reports she has had asthma and wheezing for many years.  Feels that her symptoms worsened in the setting of allergies  and additional mucus production in her sinuses. Symptoms improve when she is sitting still/lying down to go to sleep. She denies chest pain, lower extremity edema, fatigue, palpitations, melena, hematuria, hemoptysis, diaphoresis, weakness, presyncope, syncope, orthopnea, and PND. ? ?Past Medical History:  ?Diagnosis Date  ? A-fib ([place])   ? Anxiety   ? Depression   ? GERD (gastroesophageal reflux disease)   ? History of asthma   ? no meds. in 2 yr.  ? Hypertension   ? under control with med., has been on med. x 3-4 yr.  ? Lipoma of back 10/2012  ? Osteoarthritis   ? Pneumonia 04/2014  ? PONV (postoperative nausea and vomiting)   ? Stress incontinence   ? ? ?Past Surgical History:  ?Procedure Laterality Date  ? APPENDECTOMY    ? KNEE ARTHROPLASTY Right 2009  ? LIPOMA EXCISION Right 11/13/2012  ? Procedure: EXCISION LIPOMA;  Surgeon: Pedro Earls, MD;  Location: [facility];  Service: General;  Laterality: Right;  ? TONSILLECTOMY    ? age 28  ? ? ?Current Medications: ?Current Meds  ?Medication Sig  ? albuterol (VENTOLIN HFA) 108 (90 Base) MCG/ACT inhaler Inhale 2 puffs into the lungs every 6 (six) hours as needed for wheezing or shortness of breath.  ? amiodarone (PACERONE) 200 MG tablet TAKE 1 TABLET BY MOUTH EVERY DAY  ? benzonatate (TESSALON) 200 MG capsule Take 200 mg by mouth 2 (two) times daily as needed for cough.   ? CALCIUM & MAGNESIUM CARBONATES PO Take  2 capsules by mouth daily.  ? furosemide (LASIX) 40 MG tablet Take 1 tablet (40 mg total) by mouth daily.  ? Melatonin 5 MG TABS Take 5 mg by mouth every evening.  ? metoprolol succinate (TOPROL-XL) 25 MG 24 hr tablet TAKE 1 TABLET (25 MG TOTAL) BY MOUTH DAILY.  ? NUTRITIONAL SUPPLEMENTS PO Take 1 Dose by mouth daily. Serpeptase Enzymes  ? venlafaxine XR (EFFEXOR-XR) 37.5 MG 24 hr capsule Take 1 capsule by mouth as directed.  ? XARELTO 20 MG TABS tablet TAKE 1 TABLET BY MOUTH DAILY WITH SUPPER.  ? [DISCONTINUED] valsartan (DIOVAN) 160 MG tablet  TAKE 2 TABLETS BY MOUTH DAILY.  ?  ? ?Allergies:   Codeine, Dextromethorphan hbr, Lisinopril, and Paroxetine hcl  ? ?Social History  ? ?Socioeconomic History  ? Marital status: Divorced  ?  Spouse name: Not on file  ? Number of children: 0  ? Years of education: Not on file  ? Highest education level: Not on file  ?Occupational History  ? Not on file  ?Tobacco Use  ? Smoking status: Never  ? Smokeless tobacco: Never  ?Vaping Use  ? Vaping Use: Never used  ?Substance and Sexual Activity  ? Alcohol use: Yes  ?  Comment: 2 a month  ? Drug use: No  ? Sexual activity: Not Currently  ?  Partners: Male  ?  Birth control/protection: Post-menopausal  ?Other Topics Concern  ? Not on file  ?Social History Narrative  ? Not on file  ? ?Social Determinants of Health  ? ?Financial Resource Strain: Not on file  ?Food Insecurity: Not on file  ?Transportation Needs: Not on file  ?Physical Activity: Not on file  ?Stress: Not on file  ?Social Connections: Not on file  ?  ? ?Family History: ?The patient's family history includes Lung cancer in her father and mother; Throat cancer in her brother. ? ?ROS:   ?Please see the history of present illness.    ?+ wheezing ?+ SOB ?All other systems reviewed and are negative. ? ?Labs/Other Studies Reviewed:   ? ?The following studies were reviewed today: ? ?Echo 10/2016 ? ?Left ventricle:  The cavity size was normal. There was mild  ?concentric hypertrophy. Systolic function was normal. The estimated  ?ejection fraction was in the range of 60% to 65%. Wall motion was  ?normal; there were no regional wall motion abnormalities. Doppler  ?parameters are consistent with abnormal left ventricular relaxation  ?(grade 1 diastolic dysfunction). Doppler parameters are consistent  ?with indeterminate ventricular filling pressure.  ?Aortic valve:   Trileaflet; mildly thickened, mildly calcified  ?leaflets. Mobility was not restricted.  Doppler:  Transvalvular  ?velocity was within the normal range. There was  no stenosis. There  ?was no regurgitation.  ?Aorta: Aortic root: The aortic root was normal in size.  ?Ascending aorta: The ascending aorta was mildly dilated.  ?Mitral valve:   Structurally normal valve.   Mobility was not  ?restricted.  Doppler:  Transvalvular velocity was within the normal  ?range. There was no evidence for stenosis. There was trivial  ?regurgitation.  ?Left atrium:  The atrium was moderately to severely dilated.  ?Atrial septum:  No defect or patent foramen ovale was identified by  ?color flow Doppler.  ?Right ventricle:  The cavity size was normal. Wall thickness was  ?normal. Systolic function was normal.  ?Pulmonic valve:    Structurally normal valve.   Cusp separation was  ?normal.  Doppler:  Transvalvular velocity was within the normal  ?range.  There was no evidence for stenosis. There was trivial  ?regurgitation.  ?Tricuspid valve:   Structurally normal valve.    Doppler:  ?Transvalvular velocity was within the normal range. There was  ?trivial regurgitation.  ?Pulmonary artery:   The main pulmonary artery was normal-sized.  ?Systolic pressure was within the normal range.  ?Right atrium:  The atrium was normal in size.  ?Pericardium: There was no pericardial effusion.  ? ?Systemic veins:  ?Inferior vena cava: The vessel was normal in size. The  ?respirophasic diameter changes were in the normal range (>= 50%),  ?consistent with normal central venous pressure.  ? ? ?Recent Lipid Panel ?   ?Component Value Date/Time  ? CHOL 203 (H) 06/18/2019 1411  ? TRIG 73 06/18/2019 1411  ? HDL 83 06/18/2019 1411  ? CHOLHDL 2.4 06/18/2019 1411  ? LDLCALC 107 (H) 06/18/2019 1411  ? ? ? ?Risk Assessment/Calculations:   ? ?CHA2DS2-VASc Score = 4  ?{This indicates a 4.8% annual risk of stroke. ?The patient's score is based upon: ?CHF History: 1 ?HTN History: 1 ?Diabetes History: 0 ?Stroke History: 0 ?Vascular Disease History: 0 ?Age Score: 1 ?Gender Score: 1 ?   ? ?Physical Exam:   ? ?VS:  BP (!) 100/52    Pulse (!) 58   Ht '5\' 1"'$  (1.549 m)   Wt 174 lb (78.9 kg)   LMP 09/11/2002   SpO2 96%   BMI 32.88 kg/m?    ? ?Wt Readings from Last 3 Encounters:  ?07/13/21 174 lb (78.9 kg)  ?06/24/20 192 lb 9.6 oz (87.4 k

## 2021-07-13 ENCOUNTER — Encounter: Payer: Self-pay | Admitting: Nurse Practitioner

## 2021-07-13 ENCOUNTER — Ambulatory Visit: Payer: Medicare PPO | Admitting: Nurse Practitioner

## 2021-07-13 VITALS — BP 100/52 | HR 58 | Ht 61.0 in | Wt 174.0 lb

## 2021-07-13 DIAGNOSIS — I480 Paroxysmal atrial fibrillation: Secondary | ICD-10-CM

## 2021-07-13 DIAGNOSIS — I420 Dilated cardiomyopathy: Secondary | ICD-10-CM | POA: Diagnosis not present

## 2021-07-13 DIAGNOSIS — Z79899 Other long term (current) drug therapy: Secondary | ICD-10-CM

## 2021-07-13 DIAGNOSIS — R062 Wheezing: Secondary | ICD-10-CM

## 2021-07-13 DIAGNOSIS — Z7901 Long term (current) use of anticoagulants: Secondary | ICD-10-CM

## 2021-07-13 DIAGNOSIS — I10 Essential (primary) hypertension: Secondary | ICD-10-CM

## 2021-07-13 MED ORDER — ALBUTEROL SULFATE HFA 108 (90 BASE) MCG/ACT IN AERS: 2.0000 | INHALATION_SPRAY | Freq: Four times a day (QID) | RESPIRATORY_TRACT | 2 refills | Status: DC | PRN

## 2021-07-13 MED ORDER — VALSARTAN 160 MG PO TABS: 160.0000 mg | ORAL_TABLET | Freq: Every day | ORAL | 3 refills | Status: DC

## 2021-07-13 NOTE — Patient Instructions (Signed)
Medication Instructions:  ? ?You may use your albuterol inhaler 1-2 puffs every 6 hours as needed for wheezing/difficulty breathing ?It may make you feel a little bit jittery afterwards but that will improve. ? ?*If you need a refill on your cardiac medications before your next appointment, please call your pharmacy* ? ? ?Lab Work: ? ?TODAY!!!!!  CMET/PRO BNP/CBC/TSH ? ?If you have labs (blood work) drawn today and your tests are completely normal, you will receive your results only by: ?MyChart Message (if you have MyChart) OR ?A paper copy in the mail ?If you have any lab test that is abnormal or we need to change your treatment, we will call you to review the results. ? ? ?Follow-Up: ?At [facility], you and your health needs are our priority.  As part of our continuing mission to provide you with exceptional heart care, we have created designated Provider Care Teams.  These Care Teams include your primary Cardiologist (physician) and Advanced Practice Providers (APPs -  Physician Assistants and Nurse Practitioners) who all work together to provide you with the care you need, when you need it. ? ?We recommend signing up for the patient portal called "MyChart".  Sign up information is provided on this After Visit Summary.  MyChart is used to connect with patients for Virtual Visits (Telemedicine).  Patients are able to view lab/test results, encounter notes, upcoming appointments, etc.  Non-urgent messages can be sent to your provider as well.   ?To learn more about what you can do with MyChart, go to [email or web address].   ? ?Your next appointment:   ?6 month(s) ? ?The format for your next appointment:   ?In Person ? ?Provider:   ?Larae Grooms, MD   ? ? ?

## 2021-07-14 LAB — COMPREHENSIVE METABOLIC PANEL
ALT: 8 IU/L (ref 0–32)
AST: 19 IU/L (ref 0–40)
Albumin/Globulin Ratio: 2.4 — ABNORMAL HIGH (ref 1.2–2.2)
Albumin: 4.3 g/dL (ref 3.7–4.7)
Alkaline Phosphatase: 58 IU/L (ref 44–121)
BUN/Creatinine Ratio: 19 (ref 12–28)
BUN: 16 mg/dL (ref 8–27)
Bilirubin Total: <0.2 mg/dL (ref 0.0–1.2)
CO2: 23 mmol/L (ref 20–29)
Calcium: 9.4 mg/dL (ref 8.7–10.3)
Chloride: 100 mmol/L (ref 96–106)
Creatinine, Ser: 0.84 mg/dL (ref 0.57–1.00)
Globulin, Total: 1.8 g/dL (ref 1.5–4.5)
Glucose: 107 mg/dL — ABNORMAL HIGH (ref 70–99)
Potassium: 4.3 mmol/L (ref 3.5–5.2)
Sodium: 138 mmol/L (ref 134–144)
Total Protein: 6.1 g/dL (ref 6.0–8.5)
eGFR: 74 mL/min/{1.73_m2} (ref >59)

## 2021-07-14 LAB — CBC
Hematocrit: 42.2 % (ref 34.0–46.6)
Hemoglobin: 14.3 g/dL (ref 11.1–15.9)
MCH: 31.2 pg (ref 26.6–33.0)
MCHC: 33.9 g/dL (ref 31.5–35.7)
MCV: 92 fL (ref 79–97)
Platelets: 255 10*3/uL (ref 150–450)
RBC: 4.59 x10E6/uL (ref 3.77–5.28)
RDW: 13.3 % (ref 11.7–15.4)
WBC: 6.9 10*3/uL (ref 3.4–10.8)

## 2021-07-14 LAB — TSH: TSH: 1.62 u[IU]/mL (ref 0.450–4.500)

## 2021-07-14 LAB — PRO B NATRIURETIC PEPTIDE: NT-Pro BNP: 278 pg/mL (ref 0–301)

## 2021-07-20 ENCOUNTER — Telehealth: Payer: Self-pay | Admitting: Interventional Cardiology

## 2021-07-20 NOTE — Telephone Encounter (Signed)
Benefits out weight any risks. She should use it as she needs for SOB.  ?

## 2021-07-20 NOTE — Telephone Encounter (Signed)
Pt c/o medication issue: ? ?1. Name of Medication:  ?albuterol (VENTOLIN HFA) 108 (90 Base) MCG/ACT inhaler ? ?2. How are you currently taking this medication (dosage and times per day)?  ? ?3. Are you having a reaction (difficulty breathing--STAT)?  ? ?4. What is your medication issue?  ? ?Patient states she was reading about the side effects of this medication and she found out that it may cause elevated BP and heart issues. She would also like to discuss BP medication instructions. Please advise as able.  ?Patient sounded a little SOB on the phone, but states she feels fine. ? ? ? ?

## 2021-07-20 NOTE — Telephone Encounter (Signed)
Called pt advised to monitor BP and HR since starting albuterol.  Pt noticeably SOB over the phone.  Advised of pharmacist recommendation.  Advised that medication can cause HR to increase but if HR >110 and is consistent to call the office.  Pt has not been scheduled to see pulmonology.  Pt asked for clarity for valsartan.  Advised pt that valsartan 160 mg PO QD is ordered.  Pt reports takes 320 mg QD.  Advised to take only 160 mg as ordered and to monitor BP.  Pt reports BP cuff broken but will obtain a new one.  No further questions or concerns.  ?

## 2021-07-27 ENCOUNTER — Encounter: Payer: Self-pay | Admitting: Nurse Practitioner

## 2021-07-27 ENCOUNTER — Ambulatory Visit: Payer: Medicare PPO | Admitting: Nurse Practitioner

## 2021-07-27 VITALS — BP 112/60 | HR 56 | Ht 61.0 in | Wt 174.0 lb

## 2021-07-27 DIAGNOSIS — R0609 Other forms of dyspnea: Secondary | ICD-10-CM | POA: Diagnosis not present

## 2021-07-27 DIAGNOSIS — J040 Acute laryngitis: Secondary | ICD-10-CM | POA: Insufficient documentation

## 2021-07-27 DIAGNOSIS — R062 Wheezing: Secondary | ICD-10-CM

## 2021-07-27 DIAGNOSIS — J383 Other diseases of vocal cords: Secondary | ICD-10-CM | POA: Diagnosis not present

## 2021-07-27 LAB — NITRIC OXIDE: FeNO level (ppb): 16

## 2021-07-27 MED ORDER — LORATADINE 10 MG PO TABS: 10.0000 mg | ORAL_TABLET | Freq: Every day | ORAL | 11 refills | Status: DC

## 2021-07-27 MED ORDER — FLUTICASONE PROPIONATE 50 MCG/ACT NA SUSP: 2.0000 | Freq: Every day | NASAL | 2 refills | Status: DC

## 2021-07-27 MED ORDER — PREDNISONE 20 MG PO TABS: 40.0000 mg | ORAL_TABLET | Freq: Every day | ORAL | 0 refills | Status: AC

## 2021-07-27 MED ORDER — PANTOPRAZOLE SODIUM 40 MG PO TBEC: 40.0000 mg | DELAYED_RELEASE_TABLET | Freq: Every day | ORAL | 0 refills | Status: DC

## 2021-07-27 NOTE — Assessment & Plan Note (Addendum)
Respiratory status stable with oxygen 94-95% on room air. No increased work of breathing or difficulties. Clinical exam and symptoms consistent with possible vocal cord dysfunction. We will obtain PFTs for further evaluation.  ?

## 2021-07-27 NOTE — Patient Instructions (Addendum)
Continue Albuterol inhaler 2 puffs every 6 hours as needed for shortness of breath or wheezing. Notify if symptoms persist despite rescue inhaler/neb use. ? ?Prednisone 40 mg daily for 5 days. Take in AM with food ?Claritin 10 mg daily for allergies ?Flonase nasal spray 2 sprays each nostril daily ?Protonix 40 mg daily for control of possible reflux ? ?Advise voice rest. Avoid throat clearing. ? ?Pulmonary function testing scheduled today.  ? ?Follow up after pulmonary function testing with Dr. Chase Caller. If symptoms do not improve or worsen, please contact office for sooner follow up or seek emergency care. ?

## 2021-07-27 NOTE — Progress Notes (Signed)
? ?'@Patient'$  ID: Stacey Caldwell, female    DOB: [birth year], 71 y.o.   MRN: [id number] ? ?Chief Complaint  ?Patient presents with  ? Follow-up  ?  Wheezing  ?Doe   ? ? ?Referring provider: ?Christain Sacramento, MD ? ?HPI: ?71 year old female, never smoker followed for DOE. She is a patient of Dr. Golden Pop and last seen in office 10/05/2018 by [facility]. Past medical history significant for PAF, diastolic dysfunction, cardiomyopathy, HTN, GERD, anxiety, depression, right thyroid nodule, obesity. ? ?TEST/EVENTS:  ?09/26/2018 ONO: 7 hr 7 min with sats >90%. There was 1 min <89% and 11 seconds less than 88% ?10/02/2018 HRCT chest: The lungs are clear without evidence of ILD or chronic lung disease. There is a right thyroid nodule, measuring 2.3 cm. There is also cholelithiasis.  ? ?10/05/2018: OV with Mack,NP. Follow up for DOE. ONO did not show any evidence of oxygen requirement. HRCT neg for ILD or any chronic lung disease. Previously ordered PFTs; however, patient never completed d/t not wanting to get COVID tested prior to testing. Suspected some aspect of restrictive lung disease d/t weight but unable to fully assess d/t decline for COVID testing and inability to complete PFTs or cardiopulmonary stress test. Advised to continue to exercise regularly and notify if she changes her mind on COVID testing.  ? ?07/27/2021: Today - acute ?Patient presents today for hoarseness and wheeze. She reports that over the past two weeks, she has become progressively more hoarse and has noticed some increased wheezing. She has also felt a little more out of breath with activity than usual. No recent cough but she has had increased nasal congestion/drainage with the pollen outside. Not currently taking any otc antihistamine. Denies any recent sick exposures, fevers, lower extremity swelling, orthopnea, PND. She was given an albuterol inhaler by her cardiologist but has yet to use it.  ? ?Allergies  ?Allergen Reactions  ? Codeine Other (See  Comments)  ?  HEADACHE  ? Dextromethorphan Hbr Other (See Comments)  ?  unspecified  ? Lisinopril Cough  ? Paroxetine Hcl Other (See Comments)  ?  unspecified  ? ? ?Immunization History  ?Administered Date(s) Administered  ? Tdap 09/24/2009  ? ? ?Past Medical History:  ?Diagnosis Date  ? A-fib ([place])   ? Anxiety   ? Depression   ? GERD (gastroesophageal reflux disease)   ? History of asthma   ? no meds. in 2 yr.  ? Hypertension   ? under control with med., has been on med. x 3-4 yr.  ? Lipoma of back 10/2012  ? Osteoarthritis   ? Pneumonia 04/2014  ? PONV (postoperative nausea and vomiting)   ? Stress incontinence   ? ? ?Tobacco History: ?Social History  ? ?Tobacco Use  ?Smoking Status Never  ?Smokeless Tobacco Never  ? ?Counseling given: Not Answered ? ? ?Outpatient Medications Prior to Visit  ?Medication Sig Dispense Refill  ? albuterol (VENTOLIN HFA) 108 (90 Base) MCG/ACT inhaler Inhale 2 puffs into the lungs every 6 (six) hours as needed for wheezing or shortness of breath. 8 g 2  ? amiodarone (PACERONE) 200 MG tablet TAKE 1 TABLET BY MOUTH EVERY DAY 90 tablet 3  ? CALCIUM & MAGNESIUM CARBONATES PO Take 2 capsules by mouth daily.    ? furosemide (LASIX) 40 MG tablet Take 1 tablet (40 mg total) by mouth daily. 90 tablet 3  ? Melatonin 5 MG TABS Take 5 mg by mouth every evening.    ? metoprolol succinate (  TOPROL-XL) 25 MG 24 hr tablet TAKE 1 TABLET (25 MG TOTAL) BY MOUTH DAILY. 30 tablet 0  ? NUTRITIONAL SUPPLEMENTS PO Take 1 Dose by mouth daily. Serpeptase Enzymes    ? valsartan (DIOVAN) 160 MG tablet Take 1 tablet (160 mg total) by mouth daily. 90 tablet 3  ? venlafaxine XR (EFFEXOR-XR) 37.5 MG 24 hr capsule Take 1 capsule by mouth as directed.    ? XARELTO 20 MG TABS tablet TAKE 1 TABLET BY MOUTH DAILY WITH SUPPER. 90 tablet 2  ? benzonatate (TESSALON) 200 MG capsule Take 200 mg by mouth 2 (two) times daily as needed for cough.   4  ? ?No facility-administered medications prior to visit.  ? ? ? ?Review of Systems:   ? ?Constitutional: No weight loss or gain, night sweats, fevers, chills, fatigue, or lassitude. ?HEENT: No headaches, difficulty swallowing, tooth/dental problems, or sore throat. No sneezing, itching, ear ache. +nasal congestion/drainage (clear), post nasal drip, hoarseness ?CV:  No chest pain, orthopnea, PND, swelling in lower extremities, anasarca, dizziness, palpitations, syncope ?Resp: +shortness of breath with exertion; wheeze. No excess mucus or change in color of mucus. No productive or non-productive. No hemoptysis. No chest wall deformity ?GI:  No heartburn, indigestion, abdominal pain, nausea, vomiting, diarrhea, change in bowel habits, loss of appetite, bloody stools.  ?GU: No dysuria, change in color of urine, urgency or frequency.  No flank pain, no hematuria  ?Skin: No rash, lesions, ulcerations ?MSK:  No joint pain or swelling.  No decreased range of motion.  No back pain. ?Neuro: No dizziness or lightheadedness.  ?Psych: No depression or anxiety. Mood stable.  ? ? ? ?Physical Exam: ? ?BP 112/60 (BP Location: Left Arm, Cuff Size: Normal)   Pulse (!) 56   Ht '5\' 1"'$  (1.549 m)   Wt 174 lb (78.9 kg)   LMP 09/11/2002   SpO2 94%   BMI 32.88 kg/m?  ? ?GEN: Pleasant, interactive, well-appearing; obese; in no acute distress. ?HEENT:  Normocephalic and atraumatic. EACs patent bilaterally. TM pearly gray with present light reflex bilaterally. PERRLA. Sclera white. Nasal turbinates erythematous, boggy, moist and patent bilaterally. Clear rhinorrhea present. Oropharynx erythematous and moist, without exudate or edema. No lesions, ulcerations  ?NECK:  Supple w/ fair ROM. No JVD present. Normal carotid impulses w/o bruits. Thyroid symmetrical with no goiter or nodules palpated. No lymphadenopathy.   ?CV: RRR, no m/r/g, no peripheral edema. Pulses intact, +2 bilaterally. No cyanosis, pallor or clubbing. ?PULMONARY:  Unlabored, regular breathing. Stridor upper airway. Lungs are clear bilaterally A&P w/o  wheezes/rales/rhonchi. No accessory muscle use. No dullness to percussion. ?GI: BS present and normoactive. Soft, non-tender to palpation. No organomegaly or masses detected. No CVA tenderness. ?MSK: No erythema, warmth or tenderness. Cap refil <2 sec all extrem. No deformities or joint swelling noted.  ?Neuro: A/Ox3. No focal deficits noted.   ?Skin: Warm, no lesions or rashe ?Psych: Normal affect and behavior. Judgement and thought content appropriate.  ? ? ? ?Lab Results: ? ?CBC ?   ?Component Value Date/Time  ? WBC 6.9 07/13/2021 1548  ? RBC 4.59 07/13/2021 1548  ? HGB 14.3 07/13/2021 1548  ? HCT 42.2 07/13/2021 1548  ? PLT 255 07/13/2021 1548  ? MCV 92 07/13/2021 1548  ? MCH 31.2 07/13/2021 1548  ? MCHC 33.9 07/13/2021 1548  ? RDW 13.3 07/13/2021 1548  ? ? ?BMET ?   ?Component Value Date/Time  ? NA 138 07/13/2021 1548  ? K 4.3 07/13/2021 1548  ? CL 100  07/13/2021 1548  ? CO2 23 07/13/2021 1548  ? GLUCOSE 107 (H) 07/13/2021 1548  ? GLUCOSE 113 (H) 11/10/2012 1500  ? BUN 16 07/13/2021 1548  ? CREATININE 0.84 07/13/2021 1548  ? CALCIUM 9.4 07/13/2021 1548  ? GFRNONAA 78 06/18/2019 1411  ? GFRAA 90 06/18/2019 1411  ? ? ?BNP ?No results found for: BNP ? ? ?Imaging: ? ?No results found. ? ? ? ?   ? View : No data to display.  ?  ?  ?  ? ? ?No results found for: NITRICOXIDE ? ? ? ? ? ?Assessment & Plan:  ? ?Vocal cord dysfunction ?Respiratory status stable with oxygen 94-95% on room air. No increased work of breathing or difficulties. Clinical exam and symptoms consistent with possible vocal cord dysfunction. We will obtain PFTs for further evaluation.  ? ?Laryngitis ?Suspect r/t vocal cord dysfunction, exacerbated by upper airway irritation from postnasal drainage. Prednisone burst. Target trigger prevention and postnasal drainage control. FeNO nl today.  ? ?Patient Instructions  ?Continue Albuterol inhaler 2 puffs every 6 hours as needed for shortness of breath or wheezing. Notify if symptoms persist despite rescue  inhaler/neb use. ? ?Prednisone 40 mg daily for 5 days. Take in AM with food ?Claritin 10 mg daily for allergies ?Flonase nasal spray 2 sprays each nostril daily ?Protonix 40 mg daily for control of possible reflux

## 2021-07-27 NOTE — Assessment & Plan Note (Signed)
Suspect r/t vocal cord dysfunction, exacerbated by upper airway irritation from postnasal drainage. Prednisone burst. Target trigger prevention and postnasal drainage control. FeNO nl today.  ? ?Patient Instructions  ?Continue Albuterol inhaler 2 puffs every 6 hours as needed for shortness of breath or wheezing. Notify if symptoms persist despite rescue inhaler/neb use. ? ?Prednisone 40 mg daily for 5 days. Take in AM with food ?Claritin 10 mg daily for allergies ?Flonase nasal spray 2 sprays each nostril daily ?Protonix 40 mg daily for control of possible reflux ? ?Advise voice rest. Avoid throat clearing. ? ?Pulmonary function testing scheduled today.  ? ?Follow up after pulmonary function testing with Dr. Chase Caller. If symptoms do not improve or worsen, please contact office for sooner follow up or seek emergency care. ? ? ?

## 2021-08-02 ENCOUNTER — Other Ambulatory Visit: Payer: Self-pay | Admitting: Interventional Cardiology

## 2021-08-02 DIAGNOSIS — I480 Paroxysmal atrial fibrillation: Secondary | ICD-10-CM

## 2021-08-04 NOTE — Telephone Encounter (Signed)
Prescription refill request for Xarelto received.  ?Indication: a fib ?Last office visit: 07/13/21 ?Weight: 174 ?Age: 71 ?Scr: 0.84 ?CrCl: 75 mL/min ?

## 2021-08-20 ENCOUNTER — Other Ambulatory Visit: Payer: Self-pay | Admitting: Nurse Practitioner

## 2021-08-20 DIAGNOSIS — J040 Acute laryngitis: Secondary | ICD-10-CM

## 2021-08-27 ENCOUNTER — Other Ambulatory Visit: Payer: Self-pay | Admitting: Interventional Cardiology

## 2021-09-01 ENCOUNTER — Ambulatory Visit (INDEPENDENT_AMBULATORY_CARE_PROVIDER_SITE_OTHER): Payer: Medicare PPO | Admitting: Internal Medicine

## 2021-09-01 DIAGNOSIS — R0609 Other forms of dyspnea: Secondary | ICD-10-CM

## 2021-09-01 DIAGNOSIS — J383 Other diseases of vocal cords: Secondary | ICD-10-CM

## 2021-09-01 LAB — PULMONARY FUNCTION TEST
DL/VA % pred: 124 %
DL/VA: 5.25 ml/min/mmHg/L
DLCO cor % pred: 146 %
DLCO cor: 25.46 ml/min/mmHg
DLCO unc % pred: 150 %
DLCO unc: 26.14 ml/min/mmHg
FEF 25-75 Post: 0.77 L/sec
FEF 25-75 Pre: 0.54 L/sec
FEF2575-%Change-Post: 42 %
FEF2575-%Pred-Post: 47 %
FEF2575-%Pred-Pre: 33 %
FEV1-%Change-Post: 29 %
FEV1-%Pred-Post: 46 %
FEV1-%Pred-Pre: 35 %
FEV1-Post: 0.89 L
FEV1-Pre: 0.69 L
FEV1FVC-%Change-Post: 41 %
FEV1FVC-%Pred-Pre: 45 %
FEV6-%Change-Post: -7 %
FEV6-%Pred-Post: 75 %
FEV6-%Pred-Pre: 81 %
FEV6-Post: 1.84 L
FEV6-Pre: 1.99 L
FEV6FVC-%Pred-Post: 105 %
FEV6FVC-%Pred-Pre: 105 %
FVC-%Change-Post: -8 %
FVC-%Pred-Post: 71 %
FVC-%Pred-Pre: 78 %
FVC-Post: 1.85 L
FVC-Pre: 2.02 L
Post FEV1/FVC ratio: 48 %
Post FEV6/FVC ratio: 100 %
Pre FEV1/FVC ratio: 34 %
Pre FEV6/FVC Ratio: 100 %
RV % pred: 144 %
RV: 3 L
TLC % pred: 110 %
TLC: 5.09 L

## 2021-09-01 NOTE — Progress Notes (Addendum)
Full PFT attempted today.Patieint only able to perform spirometry pre-post and dlco.

## 2021-09-01 NOTE — Patient Instructions (Addendum)
Full PFT attempted today.Patieint only able to perform spirometry pre-post and dlco.

## 2021-09-08 MED ORDER — BENZONATATE 200 MG PO CAPS: 200.0000 mg | ORAL_CAPSULE | Freq: Two times a day (BID) | ORAL | 4 refills | Status: DC | PRN

## 2021-09-08 NOTE — Progress Notes (Signed)
LMOM for patient to call office back. Call back information provided.

## 2021-09-08 NOTE — Addendum Note (Signed)
Addended by: Dessie Coma on: 09/08/2021 05:45 PM   Modules accepted: Orders

## 2021-09-08 NOTE — Progress Notes (Signed)
Please notify patient that her PFTs showed severe obstruction with significant bronchodilator response, consistent with asthma. Send in Symbicort 80 2 puffs Twice daily. Brush tongue and rinse mouth afterwards. Advise her to use regardless of her symptoms. Can still use albuterol PRN as directed. We will see how this helps at her follow up with Dr. Chase Caller. Thanks!

## 2021-09-09 ENCOUNTER — Telehealth: Payer: Self-pay | Admitting: Internal Medicine

## 2021-09-11 MED ORDER — BUDESONIDE-FORMOTEROL FUMARATE 80-4.5 MCG/ACT IN AERO: 2.0000 | INHALATION_SPRAY | Freq: Two times a day (BID) | RESPIRATORY_TRACT | 12 refills | Status: DC

## 2021-09-11 NOTE — Telephone Encounter (Signed)
Progress Notes by Clayton Bibles, NP at 09/01/2021 4:00 PM  Author: Clayton Bibles, NP Author Type: Nurse Practitioner Filed: 09/08/2021 11:23 AM  Note Status: Signed Cosign: Cosign Not Required Encounter Date: 09/01/2021  Editor: Clayton Bibles, NP (Nurse Practitioner)             Please notify patient that her PFTs showed severe obstruction with significant bronchodilator response, consistent with asthma. Send in Symbicort 80 2 puffs Twice daily. Brush tongue and rinse mouth afterwards. Advise her to use regardless of her symptoms. Can still use albuterol PRN as directed. We will see how this helps at her follow up with Dr. Chase Caller. Thanks      Called and spoke with patient about PFT results. Advised her Stacey Caldwell wants to send in an inhaler for her to use as prescribed and she will follow up with Dr. Chase Caller on 6/20. She expressed understanding and verified preferred pharmacy. Nothing further needed at this time.

## 2021-09-21 ENCOUNTER — Telehealth: Payer: Self-pay | Admitting: Nurse Practitioner

## 2021-09-23 NOTE — Telephone Encounter (Signed)
Attempted to call pt but unable to reach. Left message for her to return call. 

## 2021-09-23 NOTE — Telephone Encounter (Signed)
Lm for patient.  

## 2021-09-23 NOTE — Telephone Encounter (Signed)
Patient is returning phone call. Patient phone number is [phone].

## 2021-09-24 NOTE — Telephone Encounter (Signed)
Marland Kitchen V, NP  09/08/2021 11:23 AM EDT     Please notify patient that her PFTs showed severe obstruction with significant bronchodilator response, consistent with asthma. Send in Symbicort 80 2 puffs Twice daily. Brush tongue and rinse mouth afterwards. Advise her to use regardless of her symptoms. Can still use albuterol PRN as directed. We will see how this helps at her follow up with Dr. Chase Caller. Thanks!       Spoke to patient. She stated that she received a voicemail from April on 09/18/2021. According to PFT result note, April left a message regarding results. According to med list, Symbicort was sent in on 09/12/2021. Patient is aware that call was duplicated. She voiced her understanding and had no further questions. Nothing further needed.

## 2021-09-29 ENCOUNTER — Ambulatory Visit: Payer: Medicare PPO | Admitting: Internal Medicine

## 2021-09-29 ENCOUNTER — Encounter: Payer: Self-pay | Admitting: Internal Medicine

## 2021-09-29 VITALS — BP 130/68 | HR 54 | Temp 98.0°F | Ht 61.0 in | Wt 177.0 lb

## 2021-09-29 DIAGNOSIS — J383 Other diseases of vocal cords: Secondary | ICD-10-CM | POA: Diagnosis not present

## 2021-09-29 DIAGNOSIS — R942 Abnormal results of pulmonary function studies: Secondary | ICD-10-CM

## 2021-09-29 DIAGNOSIS — R0609 Other forms of dyspnea: Secondary | ICD-10-CM | POA: Diagnosis not present

## 2021-09-29 DIAGNOSIS — R490 Dysphonia: Secondary | ICD-10-CM

## 2021-09-29 NOTE — Progress Notes (Signed)
Subjective:    Patient ID: Stacey Caldwell, female    DOB: [birth year], 71 y.o.   MRN: [id number]  Chief Complaint  Patient presents with   Consult    Referred by Dr. Estella Husk due to long term amiodarone use.   Pt states she has complaints of SOB which she states she has all the time but it is worse with exertion. Pt does have a-fib and states at times she will have pains in chest and tightness due to that.     HPI  IOV 07/05/2018  Chief Complaint  Patient presents with   Consult    Referred by Dr. Estella Husk due to long term amiodarone use.   Pt states she has complaints of SOB which she states she has all the time but it is worse with exertion. Pt does have a-fib and states at times she will have pains in chest and tightness due to that.     Stacey Caldwell -is a new consult for shortness of breath.  She tells me that she has multiple medical problems including obesity and atrial fibrillation and on different heart medications.  She also tells me that for the last 20 years she has had insidious onset of shortness of breath that is slowly progressive.  Currently quite symptomatic and documented below in terms of severity.  Made worse with exertion relieved by rest.  There is no associated wheezing or cough or orthopnea proximal nocturnal dyspnea.  There is significantly associated fatigue present.  In the last 2 years she has had accelerated worsening which is what she said initially but later said just steady progressive worsening over 20 years.  2 years ago she went on amiodarone.  She is concerned she might have amiodarone related lung issues.  When we walked her today we noticed that she was quite bradycardic at rest which she is aware of.  She was only able to walk 1 lap and then stop she did not desaturate. No relevant images available for my personal visualization   SYMPTOM SCALE - ILD 07/05/2018   O2 use Room air  Shortness of Breath 0 -> 5 scale with 5 being  worst (score 6 If unable to do)  At rest 2  Simple tasks - showers, clothes change, eating, shaving 3  Household (dishes, doing bed, laundry) 4  Shopping 3  Walking level at own pace 3  Walking keeping up with others of same age 52  Walking up Stairs 5  Walking up [place] 5  Total (40 - 48) Dyspnea Score 25  How bad is your cough? 0  How bad is your fatigue 5 - all the time       Simple office walk 185 feet x  3 laps goal with forehead probe 07/05/2018   O2 used Room air  Number laps completed 3 - did only 1 lap  Comments about pace slow  Resting Pulse Ox/HR 100% and 47/min  Final Pulse Ox/HR 98% and 98/min  Desaturated </= 88% no  Desaturated <= 3% points n  Got Tachycardic >/= 90/min NO - BRADY  Symptoms at end of test Mild dyspnea  Miscellaneous comments Stopped at 1 lap    Results for Stacey Caldwell, Stacey Caldwell (MRN [id number]) as of 07/05/2018 16:20  Ref. Range 06/06/2018 14:35  Creatinine Latest Ref Range: 0.57 - 1.00 mg/dL 0.67  Results for Stacey Caldwell, Stacey Caldwell (MRN [id number]) as of 07/05/2018 16:20  Ref. Range 06/06/2018 14:35  Hemoglobin Latest Ref  Range: 11.1 - 15.9 g/dL 13.7    sis.   10/05/2018: OV with Mack,NP. Follow up for DOE. ONO did not show any evidence of oxygen requirement. HRCT neg for ILD or any chronic lung disease. Previously ordered PFTs; however, patient never completed d/t not wanting to get COVID tested prior to testing. Suspected some aspect of restrictive lung disease d/t weight but unable to fully assess d/t decline for COVID testing and inability to complete PFTs or cardiopulmonary stress test. Advised to continue to exercise regularly and notify if she changes her mind on COVID testing.   07/27/2021: Today - acute 71 year old female, never smoker followed for DOE. She is a patient of Dr. Golden Pop and last seen in office 10/05/2018 by [facility]. Past medical history significant for PAF, diastolic dysfunction, cardiomyopathy, HTN, GERD, anxiety, depression, right  thyroid nodule, obesity.  Patient presents today for hoarseness and wheeze. She reports that over the past two weeks, she has become progressively more hoarse and has noticed some increased wheezing. She has also felt a little more out of breath with activity than usual. No recent cough but she has had increased nasal congestion/drainage with the pollen outside. Not currently taking any otc antihistamine. Denies any recent sick exposures, fevers, lower extremity swelling, orthopnea, PND. She was given an albuterol inhaler by her cardiologist but has yet to use it.    TEST/EVENTS:  09/26/2018 ONO: 7 hr 7 min with sats >90%. There was 1 min <89% and 11 seconds less than 88% 10/02/2018 HRCT chest: The lungs are clear without evidence of ILD or chronic lung disease. There is a right thyroid nodule, measuring 2.3 cm. There is also cholelithia  her PFTs showed severe obstruction with significant bronchodilator response, consistent with asthma. Send in Symbicort 80 2 puffs Twice daily. Brush tongue and rinse mouth afterwards. Advise her to use regardless of her symptoms. Can still use albuterol PRN as directed. We will see how this helps at her follow up with Dr. Chase Caller. Thanks!   OV 09/29/2021  Subjective:  Patient ID: Stacey Caldwell, female , DOB: February 19, 1951 , age 41 y.o. , MRN: [id number] , ADDRESS: [place] [id number] PCP Christain Sacramento, MD Patient Care Team: Christain Sacramento, MD as PCP - General (Family Medicine) Jettie Booze, MD as PCP - Cardiology (Cardiology)  This Provider for this visit: Treatment Team:  Attending Provider: Brand Males, MD    09/29/2021 -   Chief Complaint  Patient presents with   Follow-up    Pt states she is still having problems with her breathing especially when the weather is warmer.   Follow-up dyspnea   HPI Stacey Caldwell 71 y.o. -personally not seen in almost 3 years.  She says she continues to have dyspnea.  Now  in the interim since I last saw her 3 years ago for the last 4 months has developed hoarseness of the voice.  She has not seen the ENT specialist in over 40 years.  She saw a nurse practitioner few weeks ago and was given Symbicort on basis of obstruction.  She states Symbicort has helped her 30%.  She says she is better since seeing the nurse practitioner but compared to when she saw me she is worse.  Dyspnea is made worse by heat and humidity and also exertion.  The Symbicort is helping somewhat but not much.  She continues to have this new hoarseness of voice that has not resolved.  Her last  CT scan of the chest was in 2020.   Last echocardiogram was in 2018  blood eosinophils outside health system was 120 21.  In the clinic here she was holding up portable phone and in between was having very noisy respiration that sounded almost like stridor.  Med review shows she is on amiodarone.  And Symbicort.  She is on Xarelto.  Old CT scan of the chest shows a thyroid nodule that is calcified but she says it is not goiter.   PFT -personally visualized the images and she has a rectangle of flat flow-volume loop suggesting intrinsic airway obstruction     Latest Ref Rng & Units 09/01/2021    3:46 PM  PFT Results  FVC-Pre L 2.02   FVC-Predicted Pre % 78   FVC-Post L 1.85   FVC-Predicted Post % 71   Pre FEV1/FVC % % 34   Post FEV1/FCV % % 48   FEV1-Pre L 0.69   FEV1-Predicted Pre % 35   FEV1-Post L 0.89   DLCO uncorrected ml/min/mmHg 26.14   DLCO UNC% % 150   DLCO corrected ml/min/mmHg 25.46   DLCO COR %Predicted % 146   DLVA Predicted % 124   TLC L 5.09   TLC % Predicted % 110   RV % Predicted % 144        has a past medical history of A-fib ([place]), Anxiety, Depression, GERD (gastroesophageal reflux disease), History of asthma, Hypertension, Lipoma of back (10/2012), Osteoarthritis, Pneumonia (04/2014), PONV (postoperative nausea and vomiting), and Stress incontinence.   reports that  she has never smoked. She has never used smokeless tobacco.  Past Surgical History:  Procedure Laterality Date   APPENDECTOMY     KNEE ARTHROPLASTY Right 2009   LIPOMA EXCISION Right 11/13/2012   Procedure: EXCISION LIPOMA;  Surgeon: Pedro Earls, MD;  Location: [facility];  Service: General;  Laterality: Right;   TONSILLECTOMY     age 59    Allergies  Allergen Reactions   Codeine Other (See Comments)    HEADACHE   Dextromethorphan Hbr Other (See Comments)    unspecified   Lisinopril Cough   Paroxetine Hcl Other (See Comments)    unspecified    Immunization History  Administered Date(s) Administered   Tdap 09/24/2009    Family History  Problem Relation Age of Onset   Lung cancer Mother        and ETOH abuse   Lung cancer Father        and ETOH abuse   Throat cancer Brother        ETOH abuse     Current Outpatient Medications:    albuterol (VENTOLIN HFA) 108 (90 Base) MCG/ACT inhaler, Inhale 2 puffs into the lungs every 6 (six) hours as needed for wheezing or shortness of breath., Disp: 8 g, Rfl: 2   amiodarone (PACERONE) 200 MG tablet, TAKE 1 TABLET BY MOUTH EVERY DAY, Disp: 90 tablet, Rfl: 3   budesonide-formoterol (SYMBICORT) 80-4.5 MCG/ACT inhaler, Inhale 2 puffs into the lungs in the morning and at bedtime., Disp: 1 each, Rfl: 12   CALCIUM & MAGNESIUM CARBONATES PO, Take 2 capsules by mouth daily., Disp: , Rfl:    fluticasone (FLONASE) 50 MCG/ACT nasal spray, Place 2 sprays into both nostrils daily., Disp: 16 g, Rfl: 2   furosemide (LASIX) 40 MG tablet, TAKE 1 TABLET BY MOUTH EVERY DAY, Disp: 90 tablet, Rfl: 3   loratadine (CLARITIN) 10 MG tablet, Take 1 tablet (10 mg total)  by mouth daily., Disp: 30 tablet, Rfl: 11   Melatonin 5 MG TABS, Take 5 mg by mouth every evening., Disp: , Rfl:    metoprolol succinate (TOPROL-XL) 25 MG 24 hr tablet, TAKE 1 TABLET (25 MG TOTAL) BY MOUTH DAILY., Disp: 90 tablet, Rfl: 3   NUTRITIONAL SUPPLEMENTS PO, Take 1  Dose by mouth daily. Serpeptase Enzymes, Disp: , Rfl:    pantoprazole (PROTONIX) 40 MG tablet, TAKE 1 TABLET BY MOUTH EVERY DAY, Disp: 30 tablet, Rfl: 5   valsartan (DIOVAN) 160 MG tablet, Take 1 tablet (160 mg total) by mouth daily., Disp: 90 tablet, Rfl: 3   venlafaxine XR (EFFEXOR-XR) 37.5 MG 24 hr capsule, Take 1 capsule by mouth as directed., Disp: , Rfl:    XARELTO 20 MG TABS tablet, TAKE 1 TABLET BY MOUTH DAILY WITH SUPPER, Disp: 90 tablet, Rfl: 2      Objective:   Vitals:   09/29/21 1551  BP: 130/68  Pulse: (!) 54  Temp: 98 F (36.7 C)  TempSrc: Oral  SpO2: 98%  Weight: 177 lb (80.3 kg)  Height: '5\' 1"'$  (1.549 m)    Estimated body mass index is 33.44 kg/m as calculated from the following:   Height as of this encounter: '5\' 1"'$  (1.549 m).   Weight as of this encounter: 177 lb (80.3 kg).  '@WEIGHTCHANGE'$ @  [employer]   09/29/21 1551  Weight: 177 lb (80.3 kg)     Physical Exam    General: No distress. Obese. SITTING with portable fan. LOUD intermittent raspy voice with raspy inspiration Neuro: Alert and Oriented x 3. GCS 15. Speech normal Psych: Pleasant Resp:  Barrel Chest - no.  Wheeze - no, Crackles - no, No overt respiratory distress CVS: Normal heart sounds. Murmurs - no Ext: Stigmata of Connective Tissue Disease - no HEENT: Normal upper airway. PEERL +. No post nasal drip   Could not palpate a goiter.  Intermittently loud intermittent raspy voice especially on inspiration.     Assessment:       ICD-10-CM   1. Dyspnea on exertion  R06.09 CBC with Differential/Platelet    IgE    Resp Allergy Profile Regn2DC [facility]    ECHOCARDIOGRAM COMPLETE    CT Chest High Resolution    Ambulatory referral to ENT    Resp Allergy Profile Regn2DC DE [facility]    CT Chest High Resolution    ECHOCARDIOGRAM COMPLETE    IgE    CBC with Differential/Platelet    2. Abnormal flow volume loop  R94.2 Ambulatory referral to ENT    3. Vocal cord dysfunction  J38.3  Ambulatory referral to ENT    4. Hoarseness of voice  R49.0 Ambulatory referral to ENT     Concerned about upper airway syndrome it could be functional such as VCD.  But it could also be something pathological -such as a vocal cord nodule or mass or paralyzed vocal cord.    Plan:     Patient Instructions     ICD-10-CM   1. Dyspnea on exertion  R06.09     2. Abnormal flow volume loop  R94.2     3. Vocal cord dysfunction  J38.3     4. Hoarseness of voice  R49.0       Need to rule out complicated asthma and other reasons beyond asthma for shortness of breath Flow volume loop  Plan  - CT chest high resolution prone and supine - Do ECHO - do cbc  with diff, IgE and RAST allergy profile - refer ENT  - they might order CT neck  - urgen referal - continue symbicort  Followup' - go to ER if worse  - return to see APP or Dr Chase Caller after above    SIGNATURE    Dr. Brand Males, M.D., F.C.C.P,  Pulmonary and Critical Care Medicine Staff Physician, [facility] Director - Interstitial Lung Disease  Program  Pulmonary [place] at [place], [place], [id number]  Pager: [phone], If no answer or between  15:00h - 7:00h: call [phone] Telephone: [phone]  5:56 PM 09/29/2021

## 2021-09-29 NOTE — Progress Notes (Signed)
She has an OV with provider today and he will go over PFT.

## 2021-09-29 NOTE — Patient Instructions (Addendum)
ICD-10-CM   1. Dyspnea on exertion  R06.09     2. Abnormal flow volume loop  R94.2     3. Vocal cord dysfunction  J38.3     4. Hoarseness of voice  R49.0       Need to rule out complicated asthma and other reasons beyond asthma for shortness of breath Flow volume loop  Plan  - CT chest high resolution prone and supine - Do ECHO - do cbc with diff, IgE and RAST allergy profile - refer ENT  - they might order CT neck  - urgen referal - continue symbicort  Followup' - go to ER if worse  - return to see APP or Dr Chase Caller after above

## 2021-09-30 LAB — CBC WITH DIFFERENTIAL/PLATELET
Basophils Absolute: 0 10*3/uL (ref 0.0–0.1)
Basophils Relative: 0.6 % (ref 0.0–3.0)
Eosinophils Absolute: 0.1 10*3/uL (ref 0.0–0.7)
Eosinophils Relative: 1.3 % (ref 0.0–5.0)
HCT: 40.8 % (ref 36.0–46.0)
Hemoglobin: 13.6 g/dL (ref 12.0–15.0)
Lymphocytes Relative: 19.2 % (ref 12.0–46.0)
Lymphs Abs: 1.3 10*3/uL (ref 0.7–4.0)
MCHC: 33.3 g/dL (ref 30.0–36.0)
MCV: 94.4 fl (ref 78.0–100.0)
Monocytes Absolute: 0.7 10*3/uL (ref 0.1–1.0)
Monocytes Relative: 10.1 % (ref 3.0–12.0)
Neutro Abs: 4.5 10*3/uL (ref 1.4–7.7)
Neutrophils Relative %: 68.8 % (ref 43.0–77.0)
Platelets: 236 10*3/uL (ref 150.0–400.0)
RBC: 4.32 Mil/uL (ref 3.87–5.11)
RDW: 14.1 % (ref 11.5–15.5)
WBC: 6.6 10*3/uL (ref 4.0–10.5)

## 2021-09-30 LAB — RESPIRATORY ALLERGY PROFILE REGION II ~~LOC~~

## 2021-09-30 LAB — INTERPRETATION:

## 2021-10-04 ENCOUNTER — Other Ambulatory Visit: Payer: Self-pay | Admitting: Nurse Practitioner

## 2021-10-24 ENCOUNTER — Other Ambulatory Visit: Payer: Self-pay | Admitting: Nurse Practitioner

## 2021-10-24 DIAGNOSIS — J040 Acute laryngitis: Secondary | ICD-10-CM

## 2021-11-13 ENCOUNTER — Ambulatory Visit (HOSPITAL_COMMUNITY)
Admission: RE | Admit: 2021-11-13 | Discharge: 2021-11-13 | Disposition: A | Discharge status: Home / Self Care | Payer: Medicare PPO | Source: Ambulatory Visit | Attending: Internal Medicine | Admitting: Internal Medicine

## 2021-11-13 DIAGNOSIS — R0609 Other forms of dyspnea: Secondary | ICD-10-CM | POA: Insufficient documentation

## 2021-11-30 ENCOUNTER — Encounter: Payer: Self-pay | Admitting: Nurse Practitioner

## 2021-11-30 ENCOUNTER — Ambulatory Visit: Payer: Medicare PPO | Admitting: Nurse Practitioner

## 2021-11-30 ENCOUNTER — Telehealth: Payer: Self-pay

## 2021-11-30 VITALS — BP 142/60 | HR 65 | Temp 98.3°F | Ht 61.0 in | Wt 176.2 lb

## 2021-11-30 DIAGNOSIS — J4540 Moderate persistent asthma, uncomplicated: Secondary | ICD-10-CM | POA: Insufficient documentation

## 2021-11-30 DIAGNOSIS — R0609 Other forms of dyspnea: Secondary | ICD-10-CM | POA: Diagnosis not present

## 2021-11-30 DIAGNOSIS — J383 Other diseases of vocal cords: Secondary | ICD-10-CM

## 2021-11-30 MED ORDER — ALBUTEROL SULFATE HFA 108 (90 BASE) MCG/ACT IN AERS: INHALATION_SPRAY | RESPIRATORY_TRACT | 2 refills | Status: AC

## 2021-11-30 NOTE — Assessment & Plan Note (Signed)
Previous concern for VCD given progressive hoarseness. PFTs with evidence of fixed upper airway obstruction on flow volume loop. Given concern for possible pathological cause, urgent referral to ENT was placed by Dr. Chase Caller in June - she has an appt scheduled the second week in September. Reached out to [facility] - they are going to attempt to get her a sooner appt and call her if able. Provided with strict ED precautions should she develop increased SOB/worsening symptoms.

## 2021-11-30 NOTE — Progress Notes (Signed)
$'@Patient'Q$  ID: Franciso Bend, female    DOB: September 13, 1950, 71 y.o.   MRN: [id number]  Chief Complaint  Patient presents with   Follow-up    Follow up. Patient says things are still the same. Patient states she is still having difficulty breathing.     Referring provider: Christain [place], MD  HPI: 71 year old female, never smoker followed for DOE. She is a patient of Dr. Golden Pop and last seen in office 10/05/2018 by [facility]. Past medical history significant for PAF, diastolic dysfunction, cardiomyopathy, HTN, GERD, anxiety, depression, right thyroid nodule, obesity.  TEST/EVENTS:  09/26/2018 ONO: 7 hr 7 min with sats >90%. There was 1 min <89% and 11 seconds less than 88% 10/02/2018 HRCT chest: The lungs are clear without evidence of ILD or chronic lung disease. There is a right thyroid nodule, measuring 2.3 cm. There is also cholelithiasis.   10/05/2018: OV with Mack,NP. Follow up for DOE. ONO did not show any evidence of oxygen requirement. HRCT neg for ILD or any chronic lung disease. Previously ordered PFTs; however, patient never completed d/t not wanting to get COVID tested prior to testing. Suspected some aspect of restrictive lung disease d/t weight but unable to fully assess d/t decline for COVID testing and inability to complete PFTs or cardiopulmonary stress test. Advised to continue to exercise regularly and notify if she changes her mind on COVID testing.   07/27/2021: OV with Leeann Bady NP for hoarseness and wheeze. She reports that over the past two weeks, she has become progressively more hoarse and has noticed some increased wheezing. She has also felt a little more out of breath with activity than usual. No recent cough but she has had increased nasal congestion/drainage with the pollen outside. Not currently taking any otc antihistamine. Denies any recent sick exposures, fevers, lower extremity swelling, orthopnea, PND. She was given an albuterol inhaler by her cardiologist but has yet  to use it. Target common causes of upper airway irritation - started on PPI, flonase, and antihistamine. Prednisone burst for laryngitis. Awaiting PFTs.   09/29/2021: OV with Dr. Chase Caller for follow up after PFTs. She underwent PFTs since her last visit. Was started on Symbicort for obstruction with 30% improvement in her dyspnea. Her flow volume loop was rectangular with flat flow volume loop suggesting intrinsic airway obstruction. Concerned for VCD vs pathological caused such as vocal cord nodule or mass or paralyzed vocal cord. HRCT ordered, without evidence of ILD or tracheobronchomalacia. Ordered echocardiogram. CBC with diff and allergen panel negative. Urgent referral to ENT. Instructed to go to the ER if she worsens.   11/30/2021: Today - follow up Patient presents today for follow up. Since I saw her last, she was seen by Dr. Chase Caller who reviewed her pulmonary function testing and was concerned for fixed upper airway obstruction. She was referred to ENT but has yet to see them. She is scheduled for the second week in September. Today, she reports her breathing feels like it is unchanged compared to when she was seen last. She does feel like the symbicort helped. Unfortunately, she feels like she has a harder time talking now. Feels like it takes more effort to make sounds and she feels like her breathing is noisier than it was. She rarely has a dry cough, which is usually when she stresses her voice too much. Unable to change her voice pitch. She denies any chest tightness, difficulties swallowing, hemoptysis, orthopnea, PND. She is using the Symbicort. Rarely uses albuterol rescue  inhaler. She is taking protonix, loratidine, and flonase. She does feel like her nasal congestion has improved since she started on these. No significant reflux symptoms. She was supposed to have an echocardiogram after her last visit but has not heard from scheduling for this.   Allergies  Allergen Reactions   Codeine  Other (See Comments)    HEADACHE   Dextromethorphan Hbr Other (See Comments)    unspecified   Lisinopril Cough   Paroxetine Hcl Other (See Comments)    unspecified    Immunization History  Administered Date(s) Administered   Tdap 09/24/2009    Past Medical History:  Diagnosis Date   A-fib ([place])    Anxiety    Depression    GERD (gastroesophageal reflux disease)    History of asthma    no meds. in 2 yr.   Hypertension    under control with med., has been on med. x 3-4 yr.   Lipoma of back 10/2012   Osteoarthritis    Pneumonia 04/2014   PONV (postoperative nausea and vomiting)    Stress incontinence     Tobacco History: Social History   Tobacco Use  Smoking Status Never  Smokeless Tobacco Never   Counseling given: Not Answered   Outpatient Medications Prior to Visit  Medication Sig Dispense Refill   amiodarone (PACERONE) 200 MG tablet TAKE 1 TABLET BY MOUTH EVERY DAY 90 tablet 3   budesonide-formoterol (SYMBICORT) 80-4.5 MCG/ACT inhaler Inhale 2 puffs into the lungs in the morning and at bedtime. 1 each 12   CALCIUM & MAGNESIUM CARBONATES PO Take 2 capsules by mouth daily.     fluticasone (FLONASE) 50 MCG/ACT nasal spray SPRAY 2 SPRAYS INTO EACH NOSTRIL EVERY DAY 48 mL 2   furosemide (LASIX) 40 MG tablet TAKE 1 TABLET BY MOUTH EVERY DAY 90 tablet 3   loratadine (CLARITIN) 10 MG tablet Take 1 tablet (10 mg total) by mouth daily. 30 tablet 11   Melatonin 5 MG TABS Take 5 mg by mouth every evening.     metoprolol succinate (TOPROL-XL) 25 MG 24 hr tablet TAKE 1 TABLET (25 MG TOTAL) BY MOUTH DAILY. 90 tablet 3   NUTRITIONAL SUPPLEMENTS PO Take 1 Dose by mouth daily. Serpeptase Enzymes     pantoprazole (PROTONIX) 40 MG tablet TAKE 1 TABLET BY MOUTH EVERY DAY 30 tablet 5   valsartan (DIOVAN) 160 MG tablet Take 1 tablet (160 mg total) by mouth daily. 90 tablet 3   venlafaxine XR (EFFEXOR-XR) 37.5 MG 24 hr capsule Take 1 capsule by mouth as directed.     XARELTO 20 MG TABS  tablet TAKE 1 TABLET BY MOUTH DAILY WITH SUPPER 90 tablet 2   albuterol (VENTOLIN HFA) 108 (90 Base) MCG/ACT inhaler TAKE 2 PUFFS BY MOUTH EVERY 6 HOURS AS NEEDED FOR WHEEZE OR SHORTNESS OF BREATH 18 each 2   No facility-administered medications prior to visit.     Review of Systems:   Constitutional: No weight loss or gain, night sweats, fevers, chills, fatigue, or lassitude. HEENT: No headaches, difficulty swallowing, tooth/dental problems, or sore throat. No sneezing, itching, ear ache. +nasal congestion/drainage (improved), post nasal drip (improved), hoarseness  CV:  No chest pain, orthopnea, PND, swelling in lower extremities, anasarca, dizziness, palpitations, syncope Resp: +shortness of breath with exertion (stable); noisy breathing. No excess mucus or change in color of mucus. No productive or non-productive. No hemoptysis. No chest wall deformity GI:  No heartburn, indigestion, abdominal pain, nausea, vomiting, diarrhea, change in bowel habits, loss  of appetite, bloody stools.  GU: No dysuria, change in color of urine, urgency or frequency.  No flank pain, no hematuria  Skin: No rash, lesions, ulcerations MSK:  No joint pain or swelling.  No decreased range of motion.  No back pain. Neuro: No dizziness or lightheadedness.  Psych: No depression or anxiety. Mood stable.     Physical Exam:  BP (!) 142/60 (BP Location: Right Arm, Patient Position: Sitting, Cuff Size: Normal)   Pulse 65   Temp 98.3 F (36.8 C) (Oral)   Ht '5\' 1"'$  (1.549 m)   Wt 176 lb 3.2 oz (79.9 kg)   LMP 09/11/2002   SpO2 97%   BMI 33.29 kg/m   GEN: Pleasant, interactive, well-appearing; obese; in no acute distress. HEENT:  Normocephalic and atraumatic. EACs patent bilaterally. TM pearly gray with present light reflex bilaterally. PERRLA. Sclera white. Nasal turbinates pink, moist and patent bilaterally. No rhinorrhea present. Oropharynx erythematous and moist, without exudate or edema. No lesions,  ulcerations  NECK:  Supple w/ fair ROM. No JVD present. Normal carotid impulses w/o bruits. Thyroid symmetrical with no goiter or nodules palpated. No lymphadenopathy.   CV: RRR, no m/r/g, no peripheral edema. Pulses intact, +2 bilaterally. No cyanosis, pallor or clubbing. PULMONARY:  Unlabored, regular breathing. Stridor upper airway. Lungs are clear bilaterally A&P w/o wheezes/rales/rhonchi. No accessory muscle use. No dullness to percussion. GI: BS present and normoactive. Soft, non-tender to palpation. No organomegaly or masses detected. No CVA tenderness. MSK: No erythema, warmth or tenderness. Cap refil <2 sec all extrem. No deformities or joint swelling noted.  Neuro: A/Ox3. No focal deficits noted.   Skin: Warm, no lesions or rashe Psych: Normal affect and behavior. Judgement and thought content appropriate.     Lab Results:  CBC    Component Value Date/Time   WBC 6.6 09/29/2021 1627   RBC 4.32 09/29/2021 1627   HGB 13.6 09/29/2021 1627   HGB 14.3 07/13/2021 1548   HCT 40.8 09/29/2021 1627   HCT 42.2 07/13/2021 1548   PLT 236.0 09/29/2021 1627   PLT 255 07/13/2021 1548   MCV 94.4 09/29/2021 1627   MCV 92 07/13/2021 1548   MCH 31.2 07/13/2021 1548   MCHC 33.3 09/29/2021 1627   RDW 14.1 09/29/2021 1627   RDW 13.3 07/13/2021 1548   LYMPHSABS 1.3 09/29/2021 1627   MONOABS 0.7 09/29/2021 1627   EOSABS 0.1 09/29/2021 1627   BASOSABS 0.0 09/29/2021 1627    BMET    Component Value Date/Time   NA 138 07/13/2021 1548   K 4.3 07/13/2021 1548   CL 100 07/13/2021 1548   CO2 23 07/13/2021 1548   GLUCOSE 107 (H) 07/13/2021 1548   GLUCOSE 113 (H) 11/10/2012 1500   BUN 16 07/13/2021 1548   CREATININE 0.84 07/13/2021 1548   CALCIUM 9.4 07/13/2021 1548   GFRNONAA 78 06/18/2019 1411   GFRAA 90 06/18/2019 1411    BNP No results found for: "BNP"   Imaging:  CT Chest High Resolution  Result Date: 11/16/2021 CLINICAL DATA:  Chronic dyspnea on exertion, worsening. EXAM: CT  CHEST WITHOUT CONTRAST TECHNIQUE: Multidetector CT imaging of the chest was performed following the standard protocol without intravenous contrast. High resolution imaging of the lungs, as well as inspiratory and expiratory imaging, was performed. RADIATION DOSE REDUCTION: This exam was performed according to the departmental dose-optimization program which includes automated exposure control, adjustment of the mA and/or kV according to patient size and/or use of iterative reconstruction technique. COMPARISON:  10/02/2018  high-resolution chest CT. FINDINGS: Cardiovascular: Normal heart size. No significant pericardial effusion/thickening. Left anterior descending and right coronary atherosclerosis. Atherosclerotic thoracic aorta with dilated 4.2 cm ascending thoracic aorta. Normal caliber pulmonary arteries. Mediastinum/Nodes: Partially calcified 2.2 cm right thyroid nodule is unchanged. This has been evaluated on previous imaging. (ref: J Am Coll Radiol. 2015 Feb;12(2): 143-50). Unremarkable esophagus. No pathologically enlarged axillary, mediastinal or hilar lymph nodes, noting limited sensitivity for the detection of hilar adenopathy on this noncontrast study. Lungs/Pleura: No pneumothorax. No pleural effusion. No acute consolidative airspace disease, lung masses or significant pulmonary nodules. No significant lobular air trapping or evidence of tracheobronchomalacia on the expiration sequence. A few scattered thin parenchymal bands at the lung bases. No significant regions of subpleural reticulation, ground-glass opacity, traction bronchiectasis, architectural distortion or frank honeycombing. Upper abdomen: Small hiatal hernia. Cholelithiasis in the right upper quadrant of the abdomen on the scout radiograph. Musculoskeletal: No aggressive appearing focal osseous lesions. Marked thoracic spondylosis. IMPRESSION: 1. No evidence of interstitial lung disease. 2. Dilated 4.2 cm ascending thoracic aorta. Recommend  annual imaging followup by CTA or MRA. This recommendation follows 2010 ACCF/AHA/AATS/ACR/ASA/SCA/SCAI/SIR/STS/SVM Guidelines for the Diagnosis and Management of Patients with Thoracic Aortic Disease. Circulation. 2010; 121: Z563-O756. Aortic aneurysm NOS (ICD10-I71.9). 3. Two vessel coronary atherosclerosis. 4. Small hiatal hernia. 5. Cholelithiasis. 6.  Aortic Atherosclerosis (ICD10-I70.0). Electronically Signed   By: Ilona Sorrel M.D.   On: 11/16/2021 08:06         Latest Ref Rng & Units 09/01/2021    3:46 PM  PFT Results  FVC-Pre L 2.02   FVC-Predicted Pre % 78   FVC-Post L 1.85   FVC-Predicted Post % 71   Pre FEV1/FVC % % 34   Post FEV1/FCV % % 48   FEV1-Pre L 0.69   FEV1-Predicted Pre % 35   FEV1-Post L 0.89   DLCO uncorrected ml/min/mmHg 26.14   DLCO UNC% % 150   DLCO corrected ml/min/mmHg 25.46   DLCO COR %Predicted % 146   DLVA Predicted % 124   TLC L 5.09   TLC % Predicted % 110   RV % Predicted % 144     No results found for: "NITRICOXIDE"      Assessment & Plan:   Moderate persistent asthma Previous PFTs with significant reversibility; although, she does appear to have fixed upper airway obstruction, the bronchodilator response is concerning for underlying asthma as well. HRCT was clear. CBC with diff nl and RAST panel negative. She received benefit from Symbicort so we will continue this.   Patient Instructions  Continue Symbicort 2 puffs Twice daily. Brush tongue and rinse mouth afterwards  Continue Albuterol inhaler 2 puffs every 6 hours as needed for shortness of breath or wheezing. Notify if symptoms persist despite rescue inhaler/neb use. Continue Claritin 10 mg daily for allergies Continue Flonase nasal spray 2 sprays each nostril daily Continue Protonix 40 mg daily for control of possible reflux   Attend ENT appointment as scheduled. [facility] today - they are going to try to get you an earlier appointment and will call you if they are able  to. Keep your appointment in the meantime.  If you have any worsening in your breathing, please go to the ER for further evaluation.    Follow up in one month with Dr. Verlee Monte or Alanson Aly. If symptoms do not improve or worsen, please contact office for sooner follow up or seek emergency care.    Vocal cord dysfunction Previous concern for  VCD given progressive hoarseness. PFTs with evidence of fixed upper airway obstruction on flow volume loop. Given concern for possible pathological cause, urgent referral to ENT was placed by Dr. Chase Caller in June - she has an appt scheduled the second week in September. Reached out to [facility] - they are going to attempt to get her a sooner appt and call her if able. Provided with strict ED precautions should she develop increased SOB/worsening symptoms.   Dyspnea on exertion See above plan. Awaiting ENT referral. Her HRCT did not show any evidence of ILD or tracheobronchomalacia. No significant air trapping. She never had echocardiogram - re-ordered today. VS stable with O2 sat 97% on room air.    I spent 30 minutes of dedicated to the care of this patient on the date of this encounter to include pre-visit review of records, face-to-face time with the patient discussing conditions above, post visit ordering of testing, clinical documentation with the electronic health record, making appropriate referrals as documented, and communicating necessary findings to members of the patients care team.  Clayton Bibles, NP 11/30/2021  Pt aware and understands NP's role.

## 2021-11-30 NOTE — Patient Instructions (Addendum)
Continue Symbicort 2 puffs Twice daily. Brush tongue and rinse mouth afterwards  Continue Albuterol inhaler 2 puffs every 6 hours as needed for shortness of breath or wheezing. Notify if symptoms persist despite rescue inhaler/neb use. Continue Claritin 10 mg daily for allergies Continue Flonase nasal spray 2 sprays each nostril daily Continue Protonix 40 mg daily for control of possible reflux   Attend ENT appointment as scheduled. [facility] today - they are going to try to get you an earlier appointment and will call you if they are able to. Keep your appointment in the meantime.  If you have any worsening in your breathing, please go to the ER for further evaluation.    Follow up in one month with Stacey Caldwell or Stacey Caldwell. If symptoms do not improve or worsen, please contact office for sooner follow up or seek emergency care.

## 2021-11-30 NOTE — Telephone Encounter (Signed)
Stacey Caldwell stated that she wanted to switch pulmonologist's. She stated she wanted to switch from [place] to [place].   MR, are you okay with this switch?  NM, are you okay with this switch?

## 2021-11-30 NOTE — Assessment & Plan Note (Signed)
Previous PFTs with significant reversibility; although, she does appear to have fixed upper airway obstruction, the bronchodilator response is concerning for underlying asthma as well. HRCT was clear. CBC with diff nl and RAST panel negative. She received benefit from Symbicort so we will continue this.   Patient Instructions  Continue Symbicort 2 puffs Twice daily. Brush tongue and rinse mouth afterwards  Continue Albuterol inhaler 2 puffs every 6 hours as needed for shortness of breath or wheezing. Notify if symptoms persist despite rescue inhaler/neb use. Continue Claritin 10 mg daily for allergies Continue Flonase nasal spray 2 sprays each nostril daily Continue Protonix 40 mg daily for control of possible reflux   Attend ENT appointment as scheduled. [facility] today - they are going to try to get you an earlier appointment and will call you if they are able to. Keep your appointment in the meantime.  If you have any worsening in your breathing, please go to the ER for further evaluation.    Follow up in one month with Dr. Verlee Monte or Alanson Aly. If symptoms do not improve or worsen, please contact office for sooner follow up or seek emergency care.

## 2021-11-30 NOTE — Assessment & Plan Note (Addendum)
See above plan. Awaiting ENT referral. Her HRCT did not show any evidence of ILD or tracheobronchomalacia. No significant air trapping. She never had echocardiogram - re-ordered today. VS stable with O2 sat 97% on room air.

## 2021-11-30 NOTE — Telephone Encounter (Signed)
Ok with me 

## 2021-12-01 NOTE — Telephone Encounter (Signed)
That is fine but will be helpful to know rationale for switch  Thanks    SIGNATURE    Dr. Brand Males, M.D., F.C.C.P,  Pulmonary and Critical Care Medicine Staff Physician, [facility] Director - Interstitial Lung Disease  Program  Medical Director - [facility] [place] [facility] [place], [place], [address]  NPI Number:  NPI #[id number] [place] Number: [id number]  Pager: [phone], If no answer  -[place] or [place] [phone] Telephone (clinical office): [phone] Telephone (research): [phone]  1:21 PM 12/01/2021

## 2021-12-01 NOTE — Telephone Encounter (Signed)
Called and spoke with pt letting her know that we heard from both providers and they were fine with the switch. Pt verbalized understanding and an appt was made for pt to see Dr. Verlee Monte. Nothing further needed.

## 2021-12-18 ENCOUNTER — Ambulatory Visit (HOSPITAL_COMMUNITY): Payer: Medicare PPO | Attending: Nurse Practitioner

## 2021-12-18 DIAGNOSIS — R0609 Other forms of dyspnea: Secondary | ICD-10-CM | POA: Diagnosis present

## 2021-12-19 LAB — ECHOCARDIOGRAM COMPLETE
Area-P 1/2: 2.99 cm2
S' Lateral: 2.9 cm

## 2021-12-22 NOTE — Progress Notes (Signed)
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 Fredric Dine and was referred to Dr. Rowe Clack at [facility] for treatment.   She has to keep a fan on her when she's walking around, has DOE with minimal exertion. She has occasional cough with throat tickle. Used to have severe GERD but symptomatically improved on protonix. No dysphagia/odynophagia. No rashes. Does have joint pain in her knees and stiff hands but no signficant morning stiffness, oral/nasal ulcers, raynaud's.   STill using symbicort 80 2 puffs twice daily.    Otherwise pertinent review of systems is negative.  No family history of autoimmune disease  Never smoker. She is an Optometrist.   Past Medical History:  Diagnosis Date   A-fib ([place])    Anxiety    Depression    GERD (gastroesophageal reflux disease)    History of asthma    no meds. in 2 yr.   Hypertension    under control with med., has been on med. x 3-4 yr.   Lipoma of back 10/2012   Osteoarthritis    Pneumonia 04/2014   PONV (postoperative nausea and vomiting)    Stress incontinence      Family History  Problem Relation Age of Onset   Lung cancer Mother        and ETOH abuse   Lung cancer Father        and ETOH abuse   Throat cancer Brother        ETOH abuse     Past Surgical History:  Procedure Laterality Date   APPENDECTOMY     KNEE ARTHROPLASTY Right 2009   LIPOMA EXCISION Right 11/13/2012   Procedure: EXCISION LIPOMA;  Surgeon: Pedro Earls, MD;  Location: [facility];  Service: General;  Laterality: Right;   TONSILLECTOMY      age 45    Social History   Socioeconomic History   Marital status: Divorced    Spouse name: Not on file   Number of children: 0   Years of education: Not on file   Highest education level: Not on file  Occupational History   Not on file  Tobacco Use   Smoking status: Never   Smokeless tobacco: Never  Vaping Use   Vaping Use: Never used  Substance and Sexual Activity   Alcohol use: Yes    Comment: 2 a month   Drug use: No   Sexual activity: Not Currently    Partners: Male    Birth control/protection: Post-menopausal  Other Topics Concern   Not on file  Social History Narrative   Not on file   Social Determinants of Health   Financial Resource Strain: Not on file  Food Insecurity: Not on file  Transportation Needs: Not on file  Physical Activity: Not on file  Stress: Not on file  Social Connections: Not on file  Intimate Partner Violence: Not on file     Allergies  Allergen Reactions   Codeine Other (See Comments)    HEADACHE   Dextromethorphan Hbr Other (See Comments)    unspecified   Lisinopril Cough  Paroxetine Hcl Other (See Comments)    unspecified     Outpatient Medications Prior to Visit  Medication Sig Dispense Refill   albuterol (VENTOLIN HFA) 108 (90 Base) MCG/ACT inhaler TAKE 2 PUFFS BY MOUTH EVERY 6 HOURS AS NEEDED FOR WHEEZE OR SHORTNESS OF BREATH 18 each 2   amiodarone (PACERONE) 200 MG tablet TAKE 1 TABLET BY MOUTH EVERY DAY 90 tablet 3   budesonide-formoterol (SYMBICORT) 80-4.5 MCG/ACT inhaler Inhale 2 puffs into the lungs in the morning and at bedtime. 1 each 12   CALCIUM & MAGNESIUM CARBONATES PO Take 2 capsules by mouth daily.     fluticasone (FLONASE) 50 MCG/ACT nasal spray SPRAY 2 SPRAYS INTO EACH NOSTRIL EVERY DAY 48 mL 2   furosemide (LASIX) 40 MG tablet TAKE 1 TABLET BY MOUTH EVERY DAY 90 tablet 3   loratadine (CLARITIN) 10 MG tablet Take 1 tablet (10 mg total) by mouth daily. 30 tablet 11   Melatonin 5 MG TABS Take 5 mg by mouth  every evening.     metoprolol succinate (TOPROL-XL) 25 MG 24 hr tablet TAKE 1 TABLET (25 MG TOTAL) BY MOUTH DAILY. 90 tablet 3   NUTRITIONAL SUPPLEMENTS PO Take 1 Dose by mouth daily. Serpeptase Enzymes     pantoprazole (PROTONIX) 40 MG tablet TAKE 1 TABLET BY MOUTH EVERY DAY 30 tablet 5   valsartan (DIOVAN) 160 MG tablet Take 1 tablet (160 mg total) by mouth daily. 90 tablet 3   venlafaxine XR (EFFEXOR-XR) 37.5 MG 24 hr capsule Take 1 capsule by mouth as directed.     XARELTO 20 MG TABS tablet TAKE 1 TABLET BY MOUTH DAILY WITH SUPPER 90 tablet 2   No facility-administered medications prior to visit.       Objective:   Physical Exam:  General appearance: 71 y.o., female, NAD, conversant  Eyes: anicteric sclerae; PERRL, tracking appropriately HENT: NCAT; MMM, stridulous Neck: Trachea midline; no lymphadenopathy, no JVD Lungs: transmitted upper airway sounds, with normal respiratory effort CV: brady RR, no murmur  Abdomen: Soft, non-tender; non-distended, BS present  Extremities: No peripheral edema, warm Skin: Normal turgor and texture; no rash Psych: Appropriate affect Neuro: Alert and oriented to person and place, no focal deficit     Vitals:   12/24/21 1531  BP: 130/60  Pulse: (!) 50  SpO2: 99%  Weight: 177 lb 12.8 oz (80.6 kg)  Height: '5\' 1"'$  (1.549 m)   99% on RA BMI Readings from Last 3 Encounters:  12/24/21 33.60 kg/m  11/30/21 33.29 kg/m  09/29/21 33.44 kg/m   Wt Readings from Last 3 Encounters:  12/24/21 177 lb 12.8 oz (80.6 kg)  11/30/21 176 lb 3.2 oz (79.9 kg)  09/29/21 177 lb (80.3 kg)     CBC    Component Value Date/Time   WBC 6.6 09/29/2021 1627   RBC 4.32 09/29/2021 1627   HGB 13.6 09/29/2021 1627   HGB 14.3 07/13/2021 1548   HCT 40.8 09/29/2021 1627   HCT 42.2 07/13/2021 1548   PLT 236.0 09/29/2021 1627   PLT 255 07/13/2021 1548   MCV 94.4 09/29/2021 1627   MCV 92 07/13/2021 1548   MCH 31.2 07/13/2021 1548   MCHC 33.3 09/29/2021 1627    RDW 14.1 09/29/2021 1627   RDW 13.3 07/13/2021 1548   LYMPHSABS 1.3 09/29/2021 1627   MONOABS 0.7 09/29/2021 1627   EOSABS 0.1 09/29/2021 1627   BASOSABS 0.0 09/29/2021 1627    Chest Imaging: HRCT 11/13/21 reviewed by me unremarkable   Pulmonary  Functions Testing Results:    Latest Ref Rng & Units 09/01/2021    3:46 PM  PFT Results  FVC-Pre L 2.02   FVC-Predicted Pre % 78   FVC-Post L 1.85   FVC-Predicted Post % 71   Pre FEV1/FVC % % 34   Post FEV1/FCV % % 48   FEV1-Pre L 0.69   FEV1-Predicted Pre % 35   FEV1-Post L 0.89   DLCO uncorrected ml/min/mmHg 26.14   DLCO UNC% % 150   DLCO corrected ml/min/mmHg 25.46   DLCO COR %Predicted % 146   DLVA Predicted % 124   TLC L 5.09   TLC % Predicted % 110   RV % Predicted % 144    PFT 09/01/21 reviewed by me with severe obstruction, ++BD response, air trapping, elevated diffusing capacity, likely fixed upper airway obstruction   Echocardiogram 12/18/21:    1. Left ventricular ejection fraction, by estimation, is 60 to 65%. Left  ventricular ejection fraction by PLAX is 61 %. The left ventricle has  normal function. The left ventricle has no regional wall motion  abnormalities. There is moderate asymmetric  left ventricular hypertrophy of the basal-septal segment. Left ventricular  diastolic parameters are consistent with Grade II diastolic dysfunction  (pseudonormalization).   2. Right ventricular systolic function is normal. The right ventricular  size is normal. There is normal pulmonary artery systolic pressure.   3. Left atrial size was severely dilated.   4. Right atrial size was mildly dilated.   5. The mitral valve is normal in structure. Trivial mitral valve  regurgitation. No evidence of mitral stenosis.   6. Tricuspid valve regurgitation is moderate.   7. The aortic valve is normal in structure. Aortic valve regurgitation is  not visualized. No aortic stenosis is present.   8. Aortic dilatation noted. There is  mild dilatation of the ascending  aorta, measuring 41 mm.   9. The inferior vena cava is normal in size with greater than 50%  respiratory variability, suggesting right atrial pressure of 3 mmHg.      Assessment & Plan:   # central airway obstruction with laryngeal web ?effect of longstanding GERD vs less likely manifestation of underlying CTD (otherwise benign FNL evidently and no other s/s autoimmune disease). No clear evidence of other airway stenoses on CT. Not sure if she'll eventually need bronchoscopy for airway inspection depending on findings during treatment of her laryngeal web.  # DOE Likely predominantly due to above but possibility of underlying asthma as well though interpretation of her PFTs is challenging in setting of her CAO. Will reassess after treatment of her laryngeal web.  Plan: - CTD serologies - has appointment with Dr. Rowe Clack soon - Continue Symbicort 2 puffs Twice daily. Brush tongue and rinse mouth afterwards  - Continue Albuterol inhaler 2 puffs every 6 hours as needed for shortness of breath or wheezing. Notify if symptoms persist despite rescue inhaler/neb use. - Continue Claritin 10 mg daily for allergies - Continue Flonase nasal spray 2 sprays each nostril daily - Continue Protonix 40 mg daily for control of possible reflux    Maryjane Hurter, MD [facility] 12/24/2021 4:14 PM

## 2021-12-24 ENCOUNTER — Encounter: Payer: Self-pay | Admitting: Student

## 2021-12-24 ENCOUNTER — Ambulatory Visit: Payer: Medicare PPO | Admitting: Student

## 2021-12-24 VITALS — BP 130/60 | HR 50 | Ht 61.0 in | Wt 177.8 lb

## 2021-12-24 DIAGNOSIS — Q310 Web of larynx: Secondary | ICD-10-CM | POA: Diagnosis not present

## 2021-12-24 DIAGNOSIS — R0609 Other forms of dyspnea: Secondary | ICD-10-CM

## 2021-12-24 NOTE — Patient Instructions (Addendum)
-   Labs today - Keep appointment with Dr. Rowe Clack next Thursday - See you in 3 months or sooner/later if need be!

## 2021-12-25 LAB — C-REACTIVE PROTEIN: CRP: <1 mg/dL (ref 0.5–20.0)

## 2021-12-25 LAB — SEDIMENTATION RATE: Sed Rate: 9 mm/hr (ref 0–30)

## 2021-12-29 LAB — ANCA SCREEN W REFLEX TITER: ANCA SCREEN: NEGATIVE

## 2021-12-29 LAB — ANA+ENA+DNA/DS+SCL 70+SJOSSA/B
ANA Titer 1: NEGATIVE
ENA RNP Ab: <0.2 AI (ref 0.0–0.9)
ENA SM Ab Ser-aCnc: <0.2 AI (ref 0.0–0.9)
ENA SSA (RO) Ab: <0.2 AI (ref 0.0–0.9)
ENA SSB (LA) Ab: <0.2 AI (ref 0.0–0.9)
Scleroderma (Scl-70) (ENA) Antibody, IgG: <0.2 AI (ref 0.0–0.9)
dsDNA Ab: 2 IU/mL (ref 0–9)

## 2021-12-29 NOTE — Progress Notes (Unsigned)
Cardiology Office Note   Date:  12/30/2021   ID:  Stacey Caldwell, [place] 02/16/1951, MRN [id number]  PCP:  Christain Sacramento, MD    No chief complaint on file.  AFib  Wt Readings from Last 3 Encounters:  12/30/21 179 lb (81.2 kg)  12/24/21 177 lb 12.8 oz (80.6 kg)  11/30/21 176 lb 3.2 oz (79.9 kg)       History of Present Illness: Stacey Caldwell is a 71 y.o. female   Who was diagnosed with AFib.  She noticed DOE in April 2017 while walking to her office.  She saw her PMD, Dr. Redmond Pulling.  There was no mention of an irregular heart beat.     DOE was worse in November.  SHe went back to Dr. Redmond Pulling.  Tachycardia was detected and she was found to be in AFib.  She was treated with Toprol but no blood thinner.  She had bleeding in her rectum, and then was started on Amiodarone.     She was sent to Dr. Irven Shelling office.  She saw Dr. Woody Seller initially.  Xarelto was started. Echo and nuclear stress were done.  Echo showed EF 35-40%.  She was told she was in heart failure and needed a cardioversion.  EF by nuclear stress test was 15%.  She was thought to have a nonischemic cardiomyopathy.     Repeat EF in July of 2018 showed: Overall normal LV function and valvular function.  Cardiomyopathy improved on follow-up echo 10/2016 to LVEF 60-65%.   Decreased amiodarone to 200 mg daily in July 2018.  Planned to decrease Toprol in 1/19 if NSR was maintained.   Occasional anxiety.   Exercise has been mostly riding the bike. She does that 2x/week.  No problems with that activity.    In 2020, she was seen by Sharyn Lull.  His records show: "Says her pulse is running low 50's and having trouble getting her balance when she first gets up.No dizziness HR only gets fast if she forgets her meds. Goes to the gym twice a week for 1 hr. Works with a Clinical research associate. Does a little bit of everything. Still has chronic dyspnea on exertion that hasn't improved with treatment of her Afib.  Used to be on inhalers but they did not  help."   She had a thyroid biopsy, and it was negative for malignancy.    In 2020,  she had rare episodes of palpitations.  Episodes can last a few hours and they resolve spontaneously.  Typically occurs at night so she lies down.  No trips to ER.  She was riding her bike 3x/week.     Stamina was improving.  She saw  Pulmonary MD as well.    She had some rare rectal bleeding in the past.  No anemia.      Echo in 9/23: "Left ventricular ejection fraction, by estimation, is 60 to 65%. Left  ventricular ejection fraction by PLAX is 61 %. The left ventricle has  normal function. The left ventricle has no regional wall motion  abnormalities. There is moderate asymmetric  left ventricular hypertrophy of the basal-septal segment. Left ventricular  diastolic parameters are consistent with Grade II diastolic dysfunction  (pseudonormalization).   2. Right ventricular systolic function is normal. The right ventricular  size is normal. There is normal pulmonary artery systolic pressure.   3. Left atrial size was severely dilated.   4. Right atrial size was mildly dilated.   5. The mitral valve  is normal in structure. Trivial mitral valve  regurgitation. No evidence of mitral stenosis.   6. Tricuspid valve regurgitation is moderate.   7. The aortic valve is normal in structure. Aortic valve regurgitation is  not visualized. No aortic stenosis is present.   8. Aortic dilatation noted. There is mild dilatation of the ascending  aorta, measuring 41 mm.   9. The inferior vena cava is normal in size with greater than 50%  respiratory variability, suggesting right atrial pressure of 3 mmHg. 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  She will need surgery with ENT.  Activity limited by breathing issue.  Denies : Chest pain. Dizziness. Leg edema. Nitroglycerin use. Orthopnea. Palpitations. Paroxysmal nocturnal dyspnea.  Syncope.  Past Medical History:  Diagnosis  Date   A-fib ([place])    Anxiety    Depression    GERD (gastroesophageal reflux disease)    History of asthma    no meds. in 2 yr.   Hypertension    under control with med., has been on med. x 3-4 yr.   Lipoma of back 10/2012   Osteoarthritis    Pneumonia 04/2014   PONV (postoperative nausea and vomiting)    Stress incontinence     Past Surgical History:  Procedure Laterality Date   APPENDECTOMY     KNEE ARTHROPLASTY Right 2009   LIPOMA EXCISION Right 11/13/2012   Procedure: EXCISION LIPOMA;  Surgeon: Pedro Earls, MD;  Location: [facility];  Service: General;  Laterality: Right;   TONSILLECTOMY     age 48     Current Outpatient Medications  Medication Sig Dispense Refill   albuterol (VENTOLIN HFA) 108 (90 Base) MCG/ACT inhaler TAKE 2 PUFFS BY MOUTH EVERY 6 HOURS AS NEEDED FOR WHEEZE OR SHORTNESS OF BREATH 18 each 2   amiodarone (PACERONE) 200 MG tablet TAKE 1 TABLET BY MOUTH EVERY DAY 90 tablet 3   budesonide-formoterol (SYMBICORT) 80-4.5 MCG/ACT inhaler Inhale 2 puffs into the lungs in the morning and at bedtime. 1 each 12   CALCIUM & MAGNESIUM CARBONATES PO Take 2 capsules by mouth daily.     fluticasone (FLONASE) 50 MCG/ACT nasal spray SPRAY 2 SPRAYS INTO EACH NOSTRIL EVERY DAY 48 mL 2   furosemide (LASIX) 40 MG tablet TAKE 1 TABLET BY MOUTH EVERY DAY 90 tablet 3   loratadine (CLARITIN) 10 MG tablet Take 1 tablet (10 mg total) by mouth daily. 30 tablet 11   Melatonin 5 MG TABS Take 5 mg by mouth every evening.     metoprolol succinate (TOPROL-XL) 25 MG 24 hr tablet TAKE 1 TABLET (25 MG TOTAL) BY MOUTH DAILY. 90 tablet 3   NUTRITIONAL SUPPLEMENTS PO Take 1 Dose by mouth daily. Serpeptase Enzymes     pantoprazole (PROTONIX) 40 MG tablet TAKE 1 TABLET BY MOUTH EVERY DAY 30 tablet 5   valsartan (DIOVAN) 160 MG tablet Take 1 tablet (160 mg total) by mouth daily. 90 tablet 3   venlafaxine XR (EFFEXOR-XR) 37.5 MG 24 hr capsule Take 1 capsule by mouth as directed.      XARELTO 20 MG TABS tablet TAKE 1 TABLET BY MOUTH DAILY WITH SUPPER 90 tablet 2   No current facility-administered medications for this visit.    Allergies:   Codeine, Dextromethorphan hbr, Lisinopril, and Paroxetine hcl    Social History:  The patient  reports that she has never smoked. She has never used smokeless tobacco. She reports current alcohol use.  She reports that she does not use drugs.   Family History:  The patient's family history includes Lung cancer in her father and mother; Throat cancer in her brother.    ROS:  Please see the history of present illness.   Otherwise, review of systems are positive for stridor.   All other systems are reviewed and negative.    PHYSICAL EXAM: VS:  BP 98/64   Pulse (!) 52   Ht '5\' 1"'$  (1.549 m)   Wt 179 lb (81.2 kg)   LMP 09/11/2002   SpO2 97%   BMI 33.82 kg/m  , BMI Body mass index is 33.82 kg/m. GEN: Well nourished, well developed, in no acute distress HEENT: normal Neck: no JVD, carotid bruits, or masses Cardiac: RRR; no murmurs, rubs, or gallops,no edema  Respiratory:  clear to auscultation bilaterally, normal work of breathing GI: soft, nontender, nondistended, + BS MS: no deformity or atrophy Skin: warm and dry, no rash Neuro:  Strength and sensation are intact Psych: euthymic mood, full affect   EKG:   The ekg ordered 4/23 demonstrates NSR   Recent Labs: 07/13/2021: ALT 8; BUN 16; Creatinine, Ser 0.84; NT-Pro BNP 278; Potassium 4.3; Sodium 138; TSH 1.620 09/29/2021: Hemoglobin 13.6; Platelets 236.0   Lipid Panel    Component Value Date/Time   CHOL 203 (H) 06/18/2019 1411   TRIG 73 06/18/2019 1411   HDL 83 06/18/2019 1411   CHOLHDL 2.4 06/18/2019 1411   LDLCALC 107 (H) 06/18/2019 1411     Other studies Reviewed: Additional studies/ records that were reviewed today with results demonstrating: echo and labs reviewed.   ASSESSMENT AND PLAN:  AFib: Amio to maintain NSR. In NSR currently.  No significant  palpitations.  TSH and LFTs were normal at last check in April. Cardiomyopathy: EF normalized.  Most recent echocardiogram was reassuring. Bradycardia: No sx of bradycardia.  Hypertensive heart disease: The current medical regimen is effective;  continue present plan and medications. Anticoagulated: Xarelto for stroke prevention. No bleeding problems.  Preoperative cardiovascular exam: No further cardiac testing needed before surgery.    Current medicines are reviewed at length with the patient today.  The patient concerns regarding her medicines were addressed.  The following changes have been made:  No change  Labs/ tests ordered today include:  No orders of the defined types were placed in this encounter.   Recommend 150 minutes/week of aerobic exercise Low fat, low carb, high fiber diet recommended  Disposition:   FU in 6 months   Signed, Larae Grooms, MD  12/30/2021 4:56 PM    [facility] [address], [place], [place]  [address] Phone: [phone]; Fax: [phone]

## 2021-12-30 ENCOUNTER — Ambulatory Visit: Payer: Medicare PPO | Attending: Interventional Cardiology | Admitting: Interventional Cardiology

## 2021-12-30 ENCOUNTER — Encounter: Payer: Self-pay | Admitting: Interventional Cardiology

## 2021-12-30 VITALS — BP 98/64 | HR 52 | Ht 61.0 in | Wt 179.0 lb

## 2021-12-30 DIAGNOSIS — Z79899 Other long term (current) drug therapy: Secondary | ICD-10-CM | POA: Diagnosis not present

## 2021-12-30 DIAGNOSIS — Z7901 Long term (current) use of anticoagulants: Secondary | ICD-10-CM

## 2021-12-30 DIAGNOSIS — I420 Dilated cardiomyopathy: Secondary | ICD-10-CM | POA: Diagnosis not present

## 2021-12-30 DIAGNOSIS — I480 Paroxysmal atrial fibrillation: Secondary | ICD-10-CM | POA: Diagnosis not present

## 2021-12-30 DIAGNOSIS — I10 Essential (primary) hypertension: Secondary | ICD-10-CM

## 2021-12-30 NOTE — Patient Instructions (Signed)
Medication Instructions:  Your physician recommends that you continue on your current medications as directed. Please refer to the Current Medication list given to you today.  *If you need a refill on your cardiac medications before your next appointment, please call your pharmacy*   Lab Work: none If you have labs (blood work) drawn today and your tests are completely normal, you will receive your results only by: [place] (if you have MyChart) OR A paper copy in the mail If you have any lab test that is abnormal or we need to change your treatment, we will call you to review the results.   Testing/Procedures: none   Follow-Up: At [facility], you and your health needs are our priority.  As part of our continuing mission to provide you with exceptional heart care, we have created designated Provider Care Teams.  These Care Teams include your primary Cardiologist (physician) and Advanced Practice Providers (APPs -  Physician Assistants and Nurse Practitioners) who all work together to provide you with the care you need, when you need it.  We recommend signing up for the patient portal called "MyChart".  Sign up information is provided on this After Visit Summary.  MyChart is used to connect with patients for Virtual Visits (Telemedicine).  Patients are able to view lab/test results, encounter notes, upcoming appointments, etc.  Non-urgent messages can be sent to your provider as well.   To learn more about what you can do with MyChart, go to [email or web address].    Your next appointment:   June 24, 2021 at 4:20  The format for your next appointment:   In Person  Provider:   Larae Grooms, MD     Other Instructions    Important Information About Sugar

## 2021-12-31 ENCOUNTER — Ambulatory Visit: Payer: Medicare PPO | Admitting: Nurse Practitioner

## 2021-12-31 LAB — ENA+DNA/DS+ANTICH+CENTRO+FA...
Anti JO-1: <0.2 AI (ref 0.0–0.9)
Antiribosomal P Antibodies: <0.2 AI (ref 0.0–0.9)
Centromere Ab Screen: <0.2 AI (ref 0.0–0.9)
Chromatin Ab SerPl-aCnc: <0.2 AI (ref 0.0–0.9)
ENA RNP Ab: <0.2 AI (ref 0.0–0.9)
ENA SM Ab Ser-aCnc: <0.2 AI (ref 0.0–0.9)
ENA SSA (RO) Ab: <0.2 AI (ref 0.0–0.9)
ENA SSB (LA) Ab: <0.2 AI (ref 0.0–0.9)
Homogeneous Pattern: 1:80 {titer}
Scleroderma (Scl-70) (ENA) Antibody, IgG: <0.2 AI (ref 0.0–0.9)
Smith/RNP Antibodies: <0.2 AI (ref 0.0–0.9)
dsDNA Ab: 1 IU/mL (ref 0–9)

## 2021-12-31 LAB — ANA W/REFLEX: ANA Titer 1: POSITIVE — AB

## 2022-02-24 ENCOUNTER — Other Ambulatory Visit: Payer: Self-pay | Admitting: Nurse Practitioner

## 2022-02-24 DIAGNOSIS — J040 Acute laryngitis: Secondary | ICD-10-CM

## 2022-03-25 ENCOUNTER — Ambulatory Visit: Payer: Medicare PPO | Admitting: Student

## 2022-04-09 ENCOUNTER — Ambulatory Visit: Payer: Medicare PPO | Admitting: Student

## 2022-04-09 ENCOUNTER — Encounter: Payer: Self-pay | Admitting: Student

## 2022-04-09 VITALS — BP 128/68 | HR 55 | Temp 97.9°F | Ht 61.0 in | Wt 176.4 lb

## 2022-04-09 DIAGNOSIS — J4540 Moderate persistent asthma, uncomplicated: Secondary | ICD-10-CM

## 2022-04-09 MED ORDER — ALBUTEROL SULFATE (2.5 MG/3ML) 0.083% IN NEBU: 2.5000 mg | INHALATION_SOLUTION | Freq: Four times a day (QID) | RESPIRATORY_TRACT | 12 refills | Status: DC | PRN

## 2022-04-09 MED ORDER — ARFORMOTEROL TARTRATE 15 MCG/2ML IN NEBU: 15.0000 ug | INHALATION_SOLUTION | Freq: Two times a day (BID) | RESPIRATORY_TRACT | 12 refills | Status: DC

## 2022-04-09 MED ORDER — BUDESONIDE 0.5 MG/2ML IN SUSP: 0.5000 mg | Freq: Two times a day (BID) | RESPIRATORY_TRACT | 12 refills | Status: DC

## 2022-04-09 NOTE — Progress Notes (Signed)
Synopsis: Referred for DOE by Christain Sacramento, MD  Subjective:   PATIENT ID: Stacey Caldwell GENDER: female DOB: 1951/04/08, MRN: [id number]  Chief Complaint  Patient presents with   Follow-up    Trach placed end of Sept 2023- breathing has improved since then.    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 Fredric Dine and was referred to Dr. Rowe Clack at [facility] for treatment.   She has to keep a fan on her when she's walking around, has DOE with minimal exertion. She has occasional cough with throat tickle. Used to have severe GERD but symptomatically improved on protonix. No dysphagia/odynophagia. No rashes. Does have joint pain in her knees and stiff hands but no signficant morning stiffness, oral/nasal ulcers, raynaud's.   Still using symbicort 80 2 puffs twice daily.    No family history of autoimmune disease  Never smoker. She is an Optometrist.   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 She has a 6 shiley flex cuffless in place. Wears PM valve during the day. Excellent phonation with PMV valve.   Breathing is ok now. She does have frequent cough despite adherence to her symbicort, claritin, flonase. She tried mucinex for very productive cough but then switched to muellin leaf capsule which she found more helpful.   Otherwise pertinent review of systems is negative.   Past Medical History:  Diagnosis Date   A-fib ([place])    Anxiety    Depression    GERD (gastroesophageal reflux disease)    History of asthma    no meds. in 2 yr.   Hypertension    under control with med., has been on med. x 3-4 yr.   Lipoma of back 10/2012   Osteoarthritis    Pneumonia 04/2014   PONV (postoperative nausea and vomiting)     Stress incontinence      Family History  Problem Relation Age of Onset   Lung cancer Mother        and ETOH abuse   Lung cancer Father        and ETOH abuse   Throat cancer Brother        ETOH abuse     Past Surgical History:  Procedure Laterality Date   APPENDECTOMY     KNEE ARTHROPLASTY Right 2009   LIPOMA EXCISION Right 11/13/2012   Procedure: EXCISION LIPOMA;  Surgeon: Pedro Earls, MD;  Location: [facility];  Service: General;  Laterality: Right;   TONSILLECTOMY     age 74    Social History   Socioeconomic History   Marital status: Divorced    Spouse name: Not on file   Number of children: 0   Years of education: Not on file   Highest education level: Not on file  Occupational History   Not on file  Tobacco Use   Smoking status: Never   Smokeless tobacco: Never  Vaping Use   Vaping Use: Never used  Substance and Sexual Activity   Alcohol use: Yes    Comment: 2 a month   Drug use: No   Sexual activity: Not Currently    Partners: Male    Birth control/protection: Post-menopausal  Other Topics Concern   Not on file  Social History Narrative  Not on file   Social Determinants of Health   Financial Resource Strain: Not on file  Food Insecurity: Not on file  Transportation Needs: Not on file  Physical Activity: Not on file  Stress: Not on file  Social Connections: Not on file  Intimate Partner Violence: Not on file     Allergies  Allergen Reactions   Codeine Other (See Comments)    HEADACHE   Dextromethorphan Hbr Other (See Comments)    unspecified   Lisinopril Cough   Paroxetine Hcl Other (See Comments)    unspecified     Outpatient Medications Prior to Visit  Medication Sig Dispense Refill   albuterol (VENTOLIN HFA) 108 (90 Base) MCG/ACT inhaler TAKE 2 PUFFS BY MOUTH EVERY 6 HOURS AS NEEDED FOR WHEEZE OR SHORTNESS OF BREATH 18 each 2   amiodarone (PACERONE) 200 MG tablet TAKE 1 TABLET BY MOUTH EVERY DAY 90 tablet 3    budesonide-formoterol (SYMBICORT) 80-4.5 MCG/ACT inhaler Inhale 2 puffs into the lungs in the morning and at bedtime. 1 each 12   CALCIUM & MAGNESIUM CARBONATES PO Take 2 capsules by mouth daily.     fluticasone (FLONASE) 50 MCG/ACT nasal spray SPRAY 2 SPRAYS INTO EACH NOSTRIL EVERY DAY 48 mL 2   furosemide (LASIX) 40 MG tablet TAKE 1 TABLET BY MOUTH EVERY DAY 90 tablet 3   loratadine (CLARITIN) 10 MG tablet Take 1 tablet (10 mg total) by mouth daily. 30 tablet 11   Melatonin 5 MG TABS Take 5 mg by mouth every evening.     metoprolol succinate (TOPROL-XL) 25 MG 24 hr tablet TAKE 1 TABLET (25 MG TOTAL) BY MOUTH DAILY. 90 tablet 3   NUTRITIONAL SUPPLEMENTS PO Take 1 Dose by mouth daily. Serpeptase Enzymes     pantoprazole (PROTONIX) 40 MG tablet TAKE 1 TABLET BY MOUTH EVERY DAY 90 tablet 1   valsartan (DIOVAN) 160 MG tablet Take 1 tablet (160 mg total) by mouth daily. 90 tablet 3   venlafaxine XR (EFFEXOR-XR) 37.5 MG 24 hr capsule Take 1 capsule by mouth as directed.     XARELTO 20 MG TABS tablet TAKE 1 TABLET BY MOUTH DAILY WITH SUPPER 90 tablet 2   No facility-administered medications prior to visit.       Objective:   Physical Exam:  General appearance: 71 y.o., female, NAD, conversant  Eyes: anicteric sclerae; PERRL, tracking appropriately HENT: NCAT; 6 shiley cuffless in place with pmv Lungs: ctab with normal respiratory effort CV: brady RR, no murmur  Abdomen: Soft, non-tender; non-distended, BS present  Extremities: No peripheral edema, warm Skin: Normal turgor and texture; no rash Psych: Appropriate affect Neuro: Alert and oriented to person and place, no focal deficit     Vitals:   04/09/22 1524  BP: 128/68  Pulse: (!) 55  Temp: 97.9 F (36.6 C)  TempSrc: Oral  SpO2: 99%  Weight: 176 lb 6.4 oz (80 kg)  Height: '5\' 1"'$  (1.549 m)    99% on RA BMI Readings from Last 3 Encounters:  04/09/22 33.33 kg/m  12/30/21 33.82 kg/m  12/24/21 33.60 kg/m   Wt Readings  from Last 3 Encounters:  04/09/22 176 lb 6.4 oz (80 kg)  12/30/21 179 lb (81.2 kg)  12/24/21 177 lb 12.8 oz (80.6 kg)     CBC    Component Value Date/Time   WBC 6.6 09/29/2021 1627   RBC 4.32 09/29/2021 1627   HGB 13.6 09/29/2021 1627   HGB 14.3 07/13/2021 1548   HCT 40.8 09/29/2021  1627   HCT 42.2 07/13/2021 1548   PLT 236.0 09/29/2021 1627   PLT 255 07/13/2021 1548   MCV 94.4 09/29/2021 1627   MCV 92 07/13/2021 1548   MCH 31.2 07/13/2021 1548   MCHC 33.3 09/29/2021 1627   RDW 14.1 09/29/2021 1627   RDW 13.3 07/13/2021 1548   LYMPHSABS 1.3 09/29/2021 1627   MONOABS 0.7 09/29/2021 1627   EOSABS 0.1 09/29/2021 1627   BASOSABS 0.0 09/29/2021 1627    Chest Imaging: HRCT 11/13/21 reviewed by me unremarkable   Pulmonary Functions Testing Results:    Latest Ref Rng & Units 09/01/2021    3:46 PM  PFT Results  FVC-Pre L 2.02   FVC-Predicted Pre % 78   FVC-Post L 1.85   FVC-Predicted Post % 71   Pre FEV1/FVC % % 34   Post FEV1/FCV % % 48   FEV1-Pre L 0.69   FEV1-Predicted Pre % 35   FEV1-Post L 0.89   DLCO uncorrected ml/min/mmHg 26.14   DLCO UNC% % 150   DLCO corrected ml/min/mmHg 25.46   DLCO COR %Predicted % 146   DLVA Predicted % 124   TLC L 5.09   TLC % Predicted % 110   RV % Predicted % 144    PFT 09/01/21 reviewed by me with severe obstruction, ++BD response, air trapping, elevated diffusing capacity, likely fixed upper airway obstruction   Echocardiogram 12/18/21:    1. Left ventricular ejection fraction, by estimation, is 60 to 65%. Left  ventricular ejection fraction by PLAX is 61 %. The left ventricle has  normal function. The left ventricle has no regional wall motion  abnormalities. There is moderate asymmetric  left ventricular hypertrophy of the basal-septal segment. Left ventricular  diastolic parameters are consistent with Grade II diastolic dysfunction  (pseudonormalization).   2. Right ventricular systolic function is normal. The right  ventricular  size is normal. There is normal pulmonary artery systolic pressure.   3. Left atrial size was severely dilated.   4. Right atrial size was mildly dilated.   5. The mitral valve is normal in structure. Trivial mitral valve  regurgitation. No evidence of mitral stenosis.   6. Tricuspid valve regurgitation is moderate.   7. The aortic valve is normal in structure. Aortic valve regurgitation is  not visualized. No aortic stenosis is present.   8. Aortic dilatation noted. There is mild dilatation of the ascending  aorta, measuring 41 mm.   9. The inferior vena cava is normal in size with greater than 50%  respiratory variability, suggesting right atrial pressure of 3 mmHg.      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 For consideration of further work on glottic stenosis, possible capping trial in near future at [facility].   # DOE Substantial improvement after tracheal dilation/trach placement.   # Possible moderate persistent asthma  Plan: - would continue claritin, flonase, protonix - we'll try switching to nebs while you have your trach to see if this helps with cough. Once you get all your neb supplies stop symbicort and:  - start brovana twice daily followed immediately by pulmicort twice daily  - albuterol as needed - see you in 3 months or sooner if need be!    Maryjane Hurter, MD [facility] 04/09/2022  3:38 PM

## 2022-04-09 NOTE — Patient Instructions (Addendum)
-   would continue claritin, flonase, protonix - once you get all your neb supplies stop symbicort and:  - start brovana twice daily followed immediately by pulmicort twice daily  - albuterol as needed - see you in 3 months or sooner if need be!

## 2022-04-21 ENCOUNTER — Other Ambulatory Visit: Payer: Self-pay | Admitting: Interventional Cardiology

## 2022-04-21 DIAGNOSIS — I480 Paroxysmal atrial fibrillation: Secondary | ICD-10-CM

## 2022-04-22 NOTE — Telephone Encounter (Signed)
Prescription refill request for Xarelto received.  Indication: Last office visit: 12/30/21  Lendell Caprice MD Weight: 81.2kg Age: 72 Scr: 0.74 on 01/06/22 CrCl: 87  Based on above findings Xarelto '20mg'$  daily is the appropriate dose.  Refill approved.

## 2022-05-10 ENCOUNTER — Other Ambulatory Visit: Payer: Self-pay

## 2022-05-10 ENCOUNTER — Emergency Department (HOSPITAL_COMMUNITY)
Admission: EM | Admit: 2022-05-10 | Discharge: 2022-05-10 | Disposition: A | Discharge status: Home / Self Care | Payer: Medicare PPO | Attending: Emergency Medicine | Admitting: Emergency Medicine

## 2022-05-10 ENCOUNTER — Emergency Department (HOSPITAL_COMMUNITY): Payer: Medicare PPO

## 2022-05-10 DIAGNOSIS — Z7901 Long term (current) use of anticoagulants: Secondary | ICD-10-CM | POA: Diagnosis not present

## 2022-05-10 DIAGNOSIS — R042 Hemoptysis: Secondary | ICD-10-CM | POA: Diagnosis not present

## 2022-05-10 DIAGNOSIS — Z930 Tracheostomy status: Secondary | ICD-10-CM | POA: Diagnosis not present

## 2022-05-10 DIAGNOSIS — J9500 Unspecified tracheostomy complication: Secondary | ICD-10-CM | POA: Diagnosis not present

## 2022-05-10 MED ORDER — LIDOCAINE HCL (PF) 1 % IJ SOLN
5.0000 mL | Freq: Once | INTRAMUSCULAR | Status: DC
  Filled 2022-05-10: qty 30

## 2022-05-10 MED ORDER — TRANEXAMIC ACID FOR INHALATION
500.0000 mg | Freq: Once | RESPIRATORY_TRACT | Status: AC
  Administered 2022-05-10: 500 mg via RESPIRATORY_TRACT
  Filled 2022-05-10: qty 10

## 2022-05-10 NOTE — Discharge Instructions (Addendum)
Ok to resume Xarelto tonight as scheduled as long as no trach bleeding. If bleeding restarts, HOLD Xarelto and call Dr. Hazle Coca office for an appointment. She should hold Xarelto until she is seen by ENT if bleeding resumes. If bleeding is significant, she should return to the ED.

## 2022-05-10 NOTE — Progress Notes (Signed)
Bleeding controlled- no active bleeding and only dried red blood on trach.  TXA neb completed. CXR with trach in appropriate position.  Xarelto for Afib  Ok to discharge home with ENT follow up as scheduled. Ok to resume Xarelto tonight as scheduled as long as no trach bleeding. If bleeding restarts, she should HOLD Xarelto and call Dr. Hazle Coca office for an appointment. She should hold Xarelto until she is seen by ENT. If bleeding is significant, she should return to the ED.  These recommendations were discussed with Stacey Caldwell and her sister in the ED.   Julian Hy, DO 05/10/22 4:15 PM [facility]  For contact information, see Amion. If no response to pager, please call PCCM consult pager. After hours, 7PM- 7AM, please call Elink.

## 2022-05-10 NOTE — Procedures (Signed)
Bronchoscopy Procedure Note  NILLIE BARTOLOTTA  [id number]  11-Aug-1950  Date:05/10/22  Time:3:12 PM   Provider Performing:Rien Marland P Carlis Abbott   Procedure(s):  Flexible Bronchoscopy [phone])  Indication(s) Trach dislodged, unable to be replaced by RT  Consent Risks of the procedure as well as the alternatives and risks of each were explained to the patient and/or caregiver.  Consent for the procedure was obtained verbally.  Anesthesia 6cc of 1% lidocaine intratracheally   Time Out Verified patient identification, verified procedure, site/side was marked, verified correct patient position, special equipment/implants available, medications/allergies/relevant history reviewed, required imaging and test results available.   Sterile Technique Usual hand hygiene, masks, and gloves were used   Procedure Description Bronchoscope advanced through  tracheal stoma  and into airway.  Airways were examined down to subsegmental level with findings noted below.   Trach stoma has ingrowth of tissue with only a slit opening. This was held open and the bronch passed easily through this. There was mild erythema of the tracheal tissue near this, but distally there was healthy tracheal tissue. No obvious narrowing or distal obstructions.  Attempted to place tracheostomy tube through the stoma over the bronch. This was unsuccessful x 2 attempts due to the edge of the tracheostomy tube hanging on the redundant tissue at the stoma. The tube was able to be placed successfully over the obturator. Immediately after the position was verified with bronchoscopy. Minimal thin bloody sputum with coughing. Saturations stable.    Complications/Tolerance None; patient tolerated the procedure well. Chest X-ray is needed post procedure.   EBL <10cc   Specimen(s) None  TXA neb via trach collar now. She will not be discharged until I re-evaluate to ensure no active bleeding.  Julian Hy, DO 05/10/22 3:16  PM [facility]  For contact information, see Amion. If no response to pager, please call PCCM consult pager. After hours, 7PM- 7AM, please call Elink.

## 2022-05-10 NOTE — ED Provider Notes (Signed)
[facility] Provider Note   CSN: [id number] Arrival date & time: 05/10/22  1128     History  Chief Complaint  Patient presents with   Tracheostomy Tube Change    Stacey Caldwell is a 72 y.o. 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  Patient followed by Dr. Rowe Clack of [facility].  Patient presents to ED due to trach becoming dislodged this morning around 10:30 AM.  Patient denies shortness of breath, fevers or nausea or vomiting.  Denies chest pain.  Reports trach is due to come out in 10 days.  HPI     Home Medications Prior to Admission medications   Medication Sig Start Date End Date Taking? Authorizing Provider  albuterol (PROVENTIL) (2.5 MG/3ML) 0.083% nebulizer solution Take 3 mLs (2.5 mg total) by nebulization every 6 (six) hours as needed for wheezing or shortness of breath. 04/09/22   Maryjane Hurter, MD  albuterol (VENTOLIN HFA) 108 (90 Base) MCG/ACT inhaler TAKE 2 PUFFS BY MOUTH EVERY 6 HOURS AS NEEDED FOR WHEEZE OR SHORTNESS OF BREATH 11/30/21   Cobb, Karie Schwalbe, NP  amiodarone (PACERONE) 200 MG tablet TAKE 1 TABLET BY MOUTH EVERY DAY 08/04/21   Jettie Booze, MD  arformoterol (BROVANA) 15 MCG/2ML NEBU Take 2 mLs (15 mcg total) by nebulization 2 (two) times daily. 04/09/22   Maryjane Hurter, MD  budesonide (PULMICORT) 0.5 MG/2ML nebulizer solution Take 2 mLs (0.5 mg total) by nebulization in the morning and at bedtime. 04/09/22   Maryjane Hurter, MD  budesonide-formoterol [facility]) 80-4.5 MCG/ACT inhaler Inhale 2 puffs into the lungs in the morning and at bedtime. 09/11/21   Cobb, Karie Schwalbe, NP  CALCIUM & MAGNESIUM CARBONATES PO Take 2 capsules by mouth daily.    [provider]  fluticasone (FLONASE) 50 MCG/ACT nasal spray SPRAY 2 SPRAYS INTO EACH NOSTRIL EVERY DAY 10/26/21   Cobb, Karie Schwalbe, NP  furosemide (LASIX) 40 MG tablet TAKE 1 TABLET BY MOUTH  EVERY DAY 08/04/21   Jettie Booze, MD  loratadine (CLARITIN) 10 MG tablet Take 1 tablet (10 mg total) by mouth daily. 07/27/21   Cobb, Karie Schwalbe, NP  Melatonin 5 MG TABS Take 5 mg by mouth every evening.    [provider]  metoprolol succinate (TOPROL-XL) 25 MG 24 hr tablet TAKE 1 TABLET (25 MG TOTAL) BY MOUTH DAILY. 08/27/21   Jettie Booze, MD  NUTRITIONAL SUPPLEMENTS PO Take 1 Dose by mouth daily. Serpeptase Enzymes    [provider]  pantoprazole (PROTONIX) 40 MG tablet TAKE 1 TABLET BY MOUTH EVERY DAY 02/24/22   Cobb, Karie Schwalbe, NP  valsartan (DIOVAN) 160 MG tablet Take 1 tablet (160 mg total) by mouth daily. 08/04/21   Jettie Booze, MD  venlafaxine XR (EFFEXOR-XR) 37.5 MG 24 hr capsule Take 1 capsule by mouth as directed. 05/06/20   [provider]  XARELTO 20 MG TABS tablet TAKE 1 TABLET BY MOUTH DAILY WITH SUPPER 04/22/22   Jettie Booze, MD      Allergies    Codeine, Dextromethorphan hbr, Lisinopril, and Paroxetine hcl    Review of Systems   Review of Systems  Constitutional:  Negative for fever.  Respiratory:  Negative for shortness of breath.   Cardiovascular:  Negative for chest pain.  Gastrointestinal:  Negative for nausea and vomiting.  All other systems reviewed and are negative.   Physical Exam Updated Vital Signs  BP 123/79   Pulse (!) 56   Temp 98.2 F (36.8 C) (Oral)   Resp 18   LMP 09/11/2002   SpO2 100%  Physical Exam Vitals and nursing note reviewed.  Constitutional:      General: She is not in acute distress.    Appearance: She is well-developed.  HENT:     Head: Normocephalic and atraumatic.     Comments: Tracheostomy in place Eyes:     Conjunctiva/sclera: Conjunctivae normal.  Cardiovascular:     Rate and Rhythm: Normal rate and regular rhythm.     Heart sounds: No murmur heard. Pulmonary:     Effort: Pulmonary effort is normal. No respiratory distress.     Breath sounds: Normal breath  sounds.  Abdominal:     Palpations: Abdomen is soft.     Tenderness: There is no abdominal tenderness.  Musculoskeletal:        General: No swelling.     Cervical back: Neck supple.  Skin:    General: Skin is warm and dry.     Capillary Refill: Capillary refill takes less than 2 seconds.  Neurological:     Mental Status: She is alert.  Psychiatric:        Mood and Affect: Mood normal.     ED Results / Procedures / Treatments   Labs (all labs ordered are listed, but only abnormal results are displayed) Labs Reviewed - No data to display  EKG None  Radiology DG CHEST PORT 1 VIEW  Result Date: 05/10/2022 CLINICAL DATA:  Tracheostomy dependent EXAM: PORTABLE CHEST 1 VIEW COMPARISON:  04/15/2021 FINDINGS: Tracheostomy in place. Heart size is normal. Mild tortuosity of the aorta. The lungs are clear. The vascularity is normal. No infiltrate, collapse or effusion. No abnormal bone finding. IMPRESSION: Tracheostomy in place. No active disease. Electronically Signed   By: Nelson Chimes M.D.   On: 05/10/2022 15:29    Procedures Procedures    Medications Ordered in ED Medications  lidocaine (PF) (XYLOCAINE) 1 % injection 5 mL (has no administration in time range)  tranexamic acid (CYKLOKAPRON) 1000 MG/10ML nebulizer solution 500 mg (500 mg Nebulization Given 05/10/22 1535)    ED Course/ Medical Decision Making/ A&P                          Medical Decision Making  72 year old female presents to ED for evaluation of tracheostomy issues.  Please see HPI for further details.  Respiratory therapy consulted, came down to assess the patient however had issues getting trach tube back into place.  Pulmonology consulted at this time, Dr. Carlis Abbott came to patient bedside in triage.  Dr. Carlis Abbott performed bronchoscopy and after 2 unsuccessful attempts was able to successfully place tube over obturator.  Immediately after this position was verified the bronchoscopy.  Chest x-ray to follow which did  show stable bronc tube in place.  Patient provided tranexamic acid breathing solution for bleeding after procedure, patient on blood thinners.  Bleeding resolved after breathing treatment.  At this time, patient stable for discharge per Dr. Ainsley Spinner instructions.  The patient will be discharged back to ENT.  Patient given strict return precautions and she voiced understanding.  Patient had all of her questions answered to her satisfaction.  The patient is stable for discharge.   Final Clinical Impression(s) / ED Diagnoses Final diagnoses:  Tracheostomy complication, unspecified complication type ([place])    Rx / DC Orders ED Discharge Orders  None         Lawana Chambers 05/10/22 1633    Carmin Muskrat, MD 05/10/22 [phone]

## 2022-05-10 NOTE — ED Triage Notes (Signed)
Pt states he Lurline Idol has become dislodged @ 1030. Caregiver states she has brought replacement and all supplies needed to replace.

## 2022-05-10 NOTE — Consult Note (Signed)
NAME:  Stacey Caldwell, MRN:  [id number], DOB:  September 11, 1950, LOS: 0 ADMISSION DATE:  05/10/2022, CONSULTATION DATE:  05/10/22 REFERRING MD:  Elie Confer, PA- ED, CHIEF COMPLAINT:  trach displaced   History of Present Illness:  Stacey Caldwell is a 72 y/o woman with a history of laryngeal web s/p tracheostomy, Afib on xarelto who presented with a displaced tracheostomy tube. She woke up this morning and had her trach out. She was unable to replace it at home and presented to the ED. Last dose of Xarelto was last night. She does not have SOB today. She has an appointment in February for a trach capping trial to determine if she is a potential candidate for trach removal.  In the ED RT attempted to re- place a Shiley #8 trach and was unsuccessful. PCCM was consulted for evaluation and management.   Pertinent  Medical History  Laryngeal web Afib on xarelto HTN GERD  Significant Hospital Events: Including procedures, antibiotic start and stop dates in addition to other pertinent events     Interim History / Subjective:    Objective   Blood pressure 123/79, pulse (!) 56, temperature 98.2 F (36.8 C), temperature source Oral, resp. rate 18, last menstrual period 09/11/2002, SpO2 100 %.       No intake or output data in the 24 hours ending 05/10/22 1518 There were no vitals filed for this visit.  Examination: General: healthy appearing woman sitting up in triage in NAD HENT: [place]/AT, eyes anicteric Neck: stoma mature, has tissue ingrowth with a central slit that enlarges circumferentially. Dried blood around stoma.  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 -I spoke to the on-call ENT who recommended replacement of size 6 (ideally not smaller) tube over a bronchoscope. Bleeding should be controlled before discharging home.  -Trach replaced over an obturator after 2 unsuccessful attempts over a bronchoscope. Positioning confirmed with bronchoscope immediately, confirming intraluminal tracheal placement.  -CXR also confirmed appropriate placement -TXA neb for hemoptysis -Bleeding reassessed-- found to be controlled. Stable for discharge home with ENT follow up as scheduled previously. As long as no recurrent bleeding, she can resume her Xarelto tonight. If she has recurrent bleeding, she should hold it and schedule an appointment with Dr. Rowe Clack sooner. If bleeding is significant, she needs to return to the ED. These recommendations were reviewed with Ms. Owens Shark and her sister in the triage room and were requested to be placed in her discharge paperwork.  -Ok to resume use of speaking valves  Best Practice (right click and "Reselect all SmartList Selections" daily)  N/a  Labs   CBC: No results for input(s): "WBC", "NEUTROABS", "HGB", "HCT", "MCV", "PLT" in the last 168 hours.  Basic Metabolic Panel: No results for input(s): "NA", "K", "CL", "CO2", "GLUCOSE", "BUN", "CREATININE", "CALCIUM", "MG", "PHOS" in the last 168 hours. GFR: CrCl cannot be calculated (Patient's most recent lab result is older than the maximum 21 days allowed.). No results for input(s): "PROCALCITON", "WBC", "LATICACIDVEN" in the last 168  hours.  Liver Function Tests: No results for input(s): "AST", "ALT", "ALKPHOS", "BILITOT", "PROT", "ALBUMIN" in the last 168 hours. No results for input(s): "LIPASE", "AMYLASE" in the last 168 hours. No results  for input(s): "AMMONIA" in the last 168 hours.  ABG No results found for: "PHART", "PCO2ART", "PO2ART", "HCO3", "TCO2", "ACIDBASEDEF", "O2SAT"   Coagulation Profile: No results for input(s): "INR", "PROTIME" in the last 168 hours.  Cardiac Enzymes: No results for input(s): "CKTOTAL", "CKMB", "CKMBINDEX", "TROPONINI" in the last 168 hours.  HbA1C: No results found for: "HGBA1C"  CBG: No results for input(s): "GLUCAP" in the last 168 hours.  Review of Systems:   ROS negative other than hemoptysis after attempts to replace tracheostomy.  Past Medical History:  She,  has a past medical history of A-fib ([place]), Anxiety, Depression, GERD (gastroesophageal reflux disease), History of asthma, Hypertension, Lipoma of back (10/2012), Osteoarthritis, Pneumonia (04/2014), PONV (postoperative nausea and vomiting), and Stress incontinence.   Surgical History:   Past Surgical History:  Procedure Laterality Date   APPENDECTOMY     KNEE ARTHROPLASTY Right 2009   LIPOMA EXCISION Right 11/13/2012   Procedure: EXCISION LIPOMA;  Surgeon: Pedro Earls, MD;  Location: [place];  Service: General;  Laterality: Right;   TONSILLECTOMY     age 55     Social History:   reports that she has never smoked. She has never used smokeless tobacco. She reports current alcohol use. She reports that she does not use drugs.   Family History:  Her family history includes Lung cancer in her father and mother; Throat cancer in her brother.   Allergies Allergies  Allergen Reactions   Codeine Other (See Comments)    HEADACHE   Dextromethorphan Hbr Other (See Comments)    unspecified   Lisinopril Cough   Paroxetine Hcl Other (See Comments)    unspecified     Home Medications  Prior to Admission medications   Medication Sig Start Date End Date Taking? Authorizing Provider  albuterol (PROVENTIL) (2.5 MG/3ML) 0.083% nebulizer solution Take 3 mLs (2.5 mg total) by nebulization every 6 (six)  hours as needed for wheezing or shortness of breath. 04/09/22   Maryjane Hurter, MD  albuterol (VENTOLIN HFA) 108 (90 Base) MCG/ACT inhaler TAKE 2 PUFFS BY MOUTH EVERY 6 HOURS AS NEEDED FOR WHEEZE OR SHORTNESS OF BREATH 11/30/21   Cobb, Karie Schwalbe, NP  amiodarone (PACERONE) 200 MG tablet TAKE 1 TABLET BY MOUTH EVERY DAY 08/04/21   Jettie Booze, MD  arformoterol (BROVANA) 15 MCG/2ML NEBU Take 2 mLs (15 mcg total) by nebulization 2 (two) times daily. 04/09/22   Maryjane Hurter, MD  budesonide (PULMICORT) 0.5 MG/2ML nebulizer solution Take 2 mLs (0.5 mg total) by nebulization in the morning and at bedtime. 04/09/22   Maryjane Hurter, MD  budesonide-formoterol [facility]) 80-4.5 MCG/ACT inhaler Inhale 2 puffs into the lungs in the morning and at bedtime. 09/11/21   Cobb, Karie Schwalbe, NP  CALCIUM & MAGNESIUM CARBONATES PO Take 2 capsules by mouth daily.    [provider]  fluticasone (FLONASE) 50 MCG/ACT nasal spray SPRAY 2 SPRAYS INTO EACH NOSTRIL EVERY DAY 10/26/21   Cobb, Karie Schwalbe, NP  furosemide (LASIX) 40 MG tablet TAKE 1 TABLET BY MOUTH EVERY DAY 08/04/21   Jettie Booze, MD  loratadine (CLARITIN) 10 MG tablet Take 1 tablet (10 mg total) by mouth daily. 07/27/21   Cobb, Karie Schwalbe, NP  Melatonin 5 MG TABS Take 5 mg by mouth  every evening.    [provider]  metoprolol succinate (TOPROL-XL) 25 MG 24 hr tablet TAKE 1 TABLET (25 MG TOTAL) BY MOUTH DAILY. 08/27/21   Jettie Booze, MD  NUTRITIONAL SUPPLEMENTS PO Take 1 Dose by mouth daily. Serpeptase Enzymes    [provider]  pantoprazole (PROTONIX) 40 MG tablet TAKE 1 TABLET BY MOUTH EVERY DAY 02/24/22   Cobb, Karie Schwalbe, NP  valsartan (DIOVAN) 160 MG tablet Take 1 tablet (160 mg total) by mouth daily. 08/04/21   Jettie Booze, MD  venlafaxine XR (EFFEXOR-XR) 37.5 MG 24 hr capsule Take 1 capsule by mouth as directed. 05/06/20   [provider]  XARELTO 20 MG TABS tablet TAKE 1  TABLET BY MOUTH DAILY WITH SUPPER 04/22/22   Jettie Booze, MD     Critical care time: Beresford Slyvia Lartigue, DO 05/10/22 4:37 PM [facility]  For contact information, see Amion. If no response to pager, please call PCCM consult pager. After hours, 7PM- 7AM, please call Elink.

## 2022-05-10 NOTE — Progress Notes (Signed)
Notified by RT that they were unable to replace her tracheostomy tube. She woke up this morning and the tube was out accidentally. RT was unable to replace her tracheostomy tube. She has a history of a 2-layer laryngeal web that had a hole cut through it and she had a tracheostomy performed. Her trach tube has been in place since her surgery on 02/15/22.   I called Dr. Hazle Coca office to review this patient and ensure they are fine with the plan to scope her in the ED and potentially replace with a smaller size if we are unable to safely replace a #8. I left a message for the on call physician to please return my call.  Full consult note to follow.  Julian Hy, DO 05/10/22 2:22 PM [facility]

## 2022-05-17 ENCOUNTER — Other Ambulatory Visit: Payer: Self-pay | Admitting: Interventional Cardiology

## 2022-05-25 NOTE — Progress Notes (Unsigned)
Synopsis: Referred for DOE by Christain Sacramento, MD  Subjective:   PATIENT ID: [place] GENDER: female DOB: November 24, 1950, MRN: [id number]  No chief complaint on file.  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 Fredric Dine and was referred to Dr. Rowe Clack at [facility] for treatment.   She has to keep a fan on her when she's walking around, has DOE with minimal exertion. She has occasional cough with throat tickle. Used to have severe GERD but symptomatically improved on protonix. No dysphagia/odynophagia. No rashes. Does have joint pain in her knees and stiff hands but no signficant morning stiffness, oral/nasal ulcers, raynaud's.   Still using symbicort 80 2 puffs twice daily.    No family history of autoimmune disease  Never smoker. She is an Optometrist.   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 She has a 6 shiley flex cuffless in place. Wears PM valve during the day. Excellent phonation with PMV valve.   Breathing is ok now. She does have frequent cough despite adherence to her symbicort, claritin, flonase. She tried mucinex for very productive cough but then switched to muellin leaf capsule which she found more helpful.  --------------------------------------------------------- Underwent capping trial 2/7 at [facility] with decannulation  Otherwise pertinent review of systems is negative.   Past Medical History:  Diagnosis Date   A-fib ([place])    Anxiety    Depression    GERD (gastroesophageal reflux disease)    History of asthma    no meds. in 2 yr.   Hypertension    under control with med., has been on med. x 3-4 yr.   Lipoma of back 10/2012   Osteoarthritis    Pneumonia 04/2014   PONV (postoperative  nausea and vomiting)    Stress incontinence      Family History  Problem Relation Age of Onset   Lung cancer Mother        and ETOH abuse   Lung cancer Father        and ETOH abuse   Throat cancer Brother        ETOH abuse     Past Surgical History:  Procedure Laterality Date   APPENDECTOMY     KNEE ARTHROPLASTY Right 2009   LIPOMA EXCISION Right 11/13/2012   Procedure: EXCISION LIPOMA;  Surgeon: Pedro Earls, MD;  Location: [place];  Service: General;  Laterality: Right;   TONSILLECTOMY     age 72    Social History   Socioeconomic History   Marital status: Divorced    Spouse name: Not on file   Number of children: 0   Years of education: Not on file   Highest education level: Not on file  Occupational History   Not on file  Tobacco Use   Smoking status: Never   Smokeless tobacco: Never  Vaping Use   Vaping Use: Never used  Substance and Sexual Activity   Alcohol use: Yes    Comment: 2 a month   Drug use: No   Sexual activity: Not Currently    Partners: Male    Birth control/protection: Post-menopausal  Other Topics Concern   Not on file  Social History Narrative   Not on file   Social Determinants of Health  Financial Resource Strain: Not on file  Food Insecurity: Not on file  Transportation Needs: Not on file  Physical Activity: Not on file  Stress: Not on file  Social Connections: Not on file  Intimate Partner Violence: Not on file     Allergies  Allergen Reactions   Codeine Other (See Comments)    HEADACHE   Dextromethorphan Hbr Other (See Comments)    unspecified   Lisinopril Cough   Paroxetine Hcl Other (See Comments)    unspecified     Outpatient Medications Prior to Visit  Medication Sig Dispense Refill   albuterol (PROVENTIL) (2.5 MG/3ML) 0.083% nebulizer solution Take 3 mLs (2.5 mg total) by nebulization every 6 (six) hours as needed for wheezing or shortness of breath. 75 mL 12   albuterol (VENTOLIN HFA) 108  (90 Base) MCG/ACT inhaler TAKE 2 PUFFS BY MOUTH EVERY 6 HOURS AS NEEDED FOR WHEEZE OR SHORTNESS OF BREATH 18 each 2   amiodarone (PACERONE) 200 MG tablet TAKE 1 TABLET BY MOUTH EVERY DAY 90 tablet 3   arformoterol (BROVANA) 15 MCG/2ML NEBU Take 2 mLs (15 mcg total) by nebulization 2 (two) times daily. 120 mL 12   budesonide (PULMICORT) 0.5 MG/2ML nebulizer solution Take 2 mLs (0.5 mg total) by nebulization in the morning and at bedtime. 120 mL 12   budesonide-formoterol (SYMBICORT) 80-4.5 MCG/ACT inhaler Inhale 2 puffs into the lungs in the morning and at bedtime. 1 each 12   CALCIUM & MAGNESIUM CARBONATES PO Take 2 capsules by mouth daily.     fluticasone (FLONASE) 50 MCG/ACT nasal spray SPRAY 2 SPRAYS INTO EACH NOSTRIL EVERY DAY 48 mL 2   furosemide (LASIX) 40 MG tablet TAKE 1 TABLET BY MOUTH EVERY DAY 90 tablet 3   loratadine (CLARITIN) 10 MG tablet Take 1 tablet (10 mg total) by mouth daily. 30 tablet 11   Melatonin 5 MG TABS Take 5 mg by mouth every evening.     metoprolol succinate (TOPROL-XL) 25 MG 24 hr tablet TAKE 1 TABLET (25 MG TOTAL) BY MOUTH DAILY. 90 tablet 3   NUTRITIONAL SUPPLEMENTS PO Take 1 Dose by mouth daily. Serpeptase Enzymes     pantoprazole (PROTONIX) 40 MG tablet TAKE 1 TABLET BY MOUTH EVERY DAY 90 tablet 1   valsartan (DIOVAN) 160 MG tablet Take 1 tablet (160 mg total) by mouth daily. 90 tablet 3   venlafaxine XR (EFFEXOR-XR) 37.5 MG 24 hr capsule Take 1 capsule by mouth as directed.     XARELTO 20 MG TABS tablet TAKE 1 TABLET BY MOUTH DAILY WITH SUPPER 90 tablet 1   No facility-administered medications prior to visit.       Objective:   Physical Exam:  General appearance: 72 y.o., female, NAD, conversant  Eyes: anicteric sclerae; PERRL, tracking appropriately HENT: NCAT; 6 shiley cuffless in place with pmv Lungs: ctab with normal respiratory effort CV: brady RR, no murmur  Abdomen: Soft, non-tender; non-distended, BS present  Extremities: No peripheral  edema, warm Skin: Normal turgor and texture; no rash Psych: Appropriate affect Neuro: Alert and oriented to person and place, no focal deficit     There were no vitals filed for this visit.     on RA BMI Readings from Last 3 Encounters:  04/09/22 33.33 kg/m  12/30/21 33.82 kg/m  12/24/21 33.60 kg/m   Wt Readings from Last 3 Encounters:  04/09/22 176 lb 6.4 oz (80 kg)  12/30/21 179 lb (81.2 kg)  12/24/21 177 lb 12.8 oz (80.6 kg)  CBC    Component Value Date/Time   WBC 6.6 09/29/2021 1627   RBC 4.32 09/29/2021 1627   HGB 13.6 09/29/2021 1627   HGB 14.3 07/13/2021 1548   HCT 40.8 09/29/2021 1627   HCT 42.2 07/13/2021 1548   PLT 236.0 09/29/2021 1627   PLT 255 07/13/2021 1548   MCV 94.4 09/29/2021 1627   MCV 92 07/13/2021 1548   MCH 31.2 07/13/2021 1548   MCHC 33.3 09/29/2021 1627   RDW 14.1 09/29/2021 1627   RDW 13.3 07/13/2021 1548   LYMPHSABS 1.3 09/29/2021 1627   MONOABS 0.7 09/29/2021 1627   EOSABS 0.1 09/29/2021 1627   BASOSABS 0.0 09/29/2021 1627    Chest Imaging: HRCT 11/13/21 reviewed by me unremarkable   Pulmonary Functions Testing Results:    Latest Ref Rng & Units 09/01/2021    3:46 PM  PFT Results  FVC-Pre L 2.02   FVC-Predicted Pre % 78   FVC-Post L 1.85   FVC-Predicted Post % 71   Pre FEV1/FVC % % 34   Post FEV1/FCV % % 48   FEV1-Pre L 0.69   FEV1-Predicted Pre % 35   FEV1-Post L 0.89   DLCO uncorrected ml/min/mmHg 26.14   DLCO UNC% % 150   DLCO corrected ml/min/mmHg 25.46   DLCO COR %Predicted % 146   DLVA Predicted % 124   TLC L 5.09   TLC % Predicted % 110   RV % Predicted % 144    PFT 09/01/21 reviewed by me with severe obstruction, ++BD response, air trapping, elevated diffusing capacity, likely fixed upper airway obstruction   Echocardiogram 12/18/21:    1. Left ventricular ejection fraction, by estimation, is 60 to 65%. Left  ventricular ejection fraction by PLAX is 61 %. The left ventricle has  normal function. The  left ventricle has no regional wall motion  abnormalities. There is moderate asymmetric  left ventricular hypertrophy of the basal-septal segment. Left ventricular  diastolic parameters are consistent with Grade II diastolic dysfunction  (pseudonormalization).   2. Right ventricular systolic function is normal. The right ventricular  size is normal. There is normal pulmonary artery systolic pressure.   3. Left atrial size was severely dilated.   4. Right atrial size was mildly dilated.   5. The mitral valve is normal in structure. Trivial mitral valve  regurgitation. No evidence of mitral stenosis.   6. Tricuspid valve regurgitation is moderate.   7. The aortic valve is normal in structure. Aortic valve regurgitation is  not visualized. No aortic stenosis is present.   8. Aortic dilatation noted. There is mild dilatation of the ascending  aorta, measuring 41 mm.   9. The inferior vena cava is normal in size with greater than 50%  respiratory variability, suggesting right atrial pressure of 3 mmHg.      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 For consideration of further work on glottic stenosis, possible capping trial in near future at [facility].   # DOE Substantial improvement after tracheal dilation/trach placement.   # Possible moderate persistent asthma  Plan: - would continue claritin, flonase, protonix - we'll try switching to nebs while you have your trach to see if this helps with cough. Once you get all your neb supplies stop symbicort and:  - start brovana  twice daily followed immediately by pulmicort twice daily  - albuterol as needed - see you in 3 months or sooner if need be!    Maryjane Hurter, MD [facility] 05/25/2022 8:10 PM

## 2022-05-26 ENCOUNTER — Encounter: Payer: Self-pay | Admitting: Student

## 2022-05-26 ENCOUNTER — Ambulatory Visit: Payer: Medicare PPO | Admitting: Student

## 2022-05-26 VITALS — BP 122/70 | HR 58 | Temp 97.9°F | Ht 61.0 in | Wt 174.8 lb

## 2022-05-26 DIAGNOSIS — R0609 Other forms of dyspnea: Secondary | ICD-10-CM

## 2022-05-26 DIAGNOSIS — Q310 Web of larynx: Secondary | ICD-10-CM

## 2022-05-26 DIAGNOSIS — J4540 Moderate persistent asthma, uncomplicated: Secondary | ICD-10-CM | POA: Diagnosis not present

## 2022-05-26 NOTE — Patient Instructions (Addendum)
-   symbicort 2 puffs twice daily rinse mouth and brush tongue/teeth after each use - albuterol 1-2 puffs as needed and can use 5- 20 minutes before exercise  - will let Dr. Rowe Clack know that you're doing well! - we'll see you in 3 months or sooner if need be!

## 2022-06-24 NOTE — Progress Notes (Signed)
Cardiology Office Note   Date:  06/25/2022   ID:  Stacey Caldwell, DOB 10/13/1950, MRN [id number]  PCP:  Christain Sacramento, MD    No chief complaint on file.  AFib  Wt Readings from Last 3 Encounters:  06/25/22 177 lb (80.3 kg)  05/26/22 174 lb 12.8 oz (79.3 kg)  04/09/22 176 lb 6.4 oz (80 kg)       History of Present Illness: Stacey Caldwell is a 72 y.o. female   Who was diagnosed with AFib.  She noticed DOE in April 2017 while walking to her office.  She saw her PMD, Dr. Redmond Pulling.  There was no mention of an irregular heart beat.     DOE was worse in November.  SHe went back to Dr. Redmond Pulling.  Tachycardia was detected and she was found to be in AFib.  She was treated with Toprol but no blood thinner.  She had bleeding in her rectum, and then was started on Amiodarone.     She was sent to Dr. Irven Shelling office.  She saw Dr. Woody Seller initially.  Xarelto was started. Echo and nuclear stress were done.  Echo showed EF 35-40%.  She was told she was in heart failure and needed a cardioversion.  EF by nuclear stress test was 15%.  She was thought to have a nonischemic cardiomyopathy.     Repeat EF in July of 2018 showed: Overall normal LV function and valvular function.  Cardiomyopathy improved on follow-up echo 10/2016 to LVEF 60-65%.   Decreased amiodarone to 200 mg daily in July 2018.  Planned to decrease Toprol in 1/19 if NSR was maintained.   Occasional anxiety.   Exercise has been mostly riding the bike. She does that 2x/week.  No problems with that activity.    In 2020, she was seen by Sharyn Lull.  His records show: "Says her pulse is running low 50's and having trouble getting her balance when she first gets up.No dizziness HR only gets fast if she forgets her meds. Goes to the gym twice a week for 1 hr. Works with a Clinical research associate. Does a little bit of everything. Still has chronic dyspnea on exertion that hasn't improved with treatment of her Afib.  Used to be on inhalers but they did not  help."   She had a thyroid biopsy, and it was negative for malignancy.    In 2020,  she had rare episodes of palpitations.  Episodes can last a few hours and they resolve spontaneously.  Typically occurs at night so she lies down.  No trips to ER.  She was riding her bike 3x/week.     Stamina was improving.  She saw  Pulmonary MD as well.    She had some rare rectal bleeding in the past.  No anemia.       Echo in 9/23: "Left ventricular ejection fraction, by estimation, is 60 to 65%. Left  ventricular ejection fraction by PLAX is 61 %. The left ventricle has  normal function. The left ventricle has no regional wall motion  abnormalities. There is moderate asymmetric  left ventricular hypertrophy of the basal-septal segment. Left ventricular  diastolic parameters are consistent with Grade II diastolic dysfunction  (pseudonormalization).   2. Right ventricular systolic function is normal. The right ventricular  size is normal. There is normal pulmonary artery systolic pressure.   3. Left atrial size was severely dilated.   4. Right atrial size was mildly dilated.   5. The mitral  valve is normal in structure. Trivial mitral valve  regurgitation. No evidence of mitral stenosis.   6. Tricuspid valve regurgitation is moderate.   7. The aortic valve is normal in structure. Aortic valve regurgitation is  not visualized. No aortic stenosis is present.   8. Aortic dilatation noted. There is mild dilatation of the ascending  aorta, measuring 41 mm.   9. The inferior vena cava is normal in size with greater than 50%  respiratory variability, suggesting right atrial pressure of 3 mmHg. 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  She will need surgery with ENT.     Activity limited by breathing issue.  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  Breathing has improved. Trach removed in 2/24.    Denies : Chest pain. Dizziness. Leg edema. Nitroglycerin use. Orthopnea.  Palpitations. Paroxysmal nocturnal dyspnea.  Syncope.    Past Medical History:  Diagnosis Date   A-fib ([place])    Anxiety    Depression    GERD (gastroesophageal reflux disease)    History of asthma    no meds. in 2 yr.   Hypertension    under control with med., has been on med. x 3-4 yr.   Lipoma of back 10/2012   Osteoarthritis    Pneumonia 04/2014   PONV (postoperative nausea and vomiting)    Stress incontinence     Past Surgical History:  Procedure Laterality Date   APPENDECTOMY     KNEE ARTHROPLASTY Right 2009   LIPOMA EXCISION Right 11/13/2012   Procedure: EXCISION LIPOMA;  Surgeon: Pedro Earls, MD;  Location: [facility];  Service: General;  Laterality: Right;   TONSILLECTOMY     age 64     Current Outpatient Medications  Medication Sig Dispense Refill   acetaminophen (TYLENOL) 325 MG tablet Take by mouth.     albuterol (VENTOLIN HFA) 108 (90 Base) MCG/ACT inhaler TAKE 2 PUFFS BY MOUTH EVERY 6 HOURS AS NEEDED FOR WHEEZE OR SHORTNESS OF BREATH 18 each 2   amiodarone (PACERONE) 200 MG tablet TAKE 1 TABLET BY MOUTH EVERY DAY 90 tablet 3   ascorbic acid (VITAMIN C) 500 MG tablet Take 1 tablet by mouth daily.     budesonide-formoterol (SYMBICORT) 80-4.5 MCG/ACT inhaler Inhale 2 puffs into the lungs in the morning and at bedtime. 1 each 12   CALCIUM & MAGNESIUM CARBONATES PO Take 2 capsules by mouth daily.     fluticasone (FLONASE) 50 MCG/ACT nasal spray SPRAY 2 SPRAYS INTO EACH NOSTRIL EVERY DAY 48 mL 2   furosemide (LASIX) 40 MG tablet TAKE 1 TABLET BY MOUTH EVERY DAY 90 tablet 3   loratadine (CLARITIN) 10 MG tablet Take 1 tablet (10 mg total) by mouth daily. 30 tablet 11   Melatonin 5 MG TABS Take 5 mg by mouth every evening.     metoprolol succinate (TOPROL-XL) 25 MG 24 hr tablet TAKE 1 TABLET (25 MG TOTAL) BY MOUTH DAILY. 90 tablet 3   NUTRITIONAL SUPPLEMENTS PO Take 1 Dose by mouth daily. Serpeptase Enzymes     pantoprazole (PROTONIX) 40 MG tablet TAKE 1  TABLET BY MOUTH EVERY DAY 90 tablet 1   saccharomyces boulardii (FLORASTOR) 250 MG capsule Take by mouth.     valsartan (DIOVAN) 160 MG tablet Take 1 tablet (160 mg total) by mouth daily. 90 tablet 3   venlafaxine XR (EFFEXOR-XR) 37.5 MG 24 hr capsule Take 1 capsule by mouth as directed.  XARELTO 20 MG TABS tablet TAKE 1 TABLET BY MOUTH DAILY WITH SUPPER 90 tablet 1   Zinc Gluconate 100 MG TABS Take by mouth.     albuterol (PROVENTIL) (2.5 MG/3ML) 0.083% nebulizer solution Take 3 mLs (2.5 mg total) by nebulization every 6 (six) hours as needed for wheezing or shortness of breath. (Patient not taking: Reported on 05/26/2022) 75 mL 12   No current facility-administered medications for this visit.    Allergies:   Codeine, Dextromethorphan hbr, Lisinopril, and Paroxetine hcl    Social History:  The patient  reports that she has never smoked. She has never used smokeless tobacco. She reports current alcohol use. She reports that she does not use drugs.   Family History:  The patient's family history includes Lung cancer in her father and mother; Throat cancer in her brother.    ROS:  Please see the history of present illness.   Otherwise, review of systems are positive for mild weight gain.   All other systems are reviewed and negative.    PHYSICAL EXAM: VS:  BP 136/68   Pulse (!) 46   Ht 5\' 1"  (1.549 m)   Wt 177 lb (80.3 kg)   LMP 09/11/2002   SpO2 96%   BMI 33.44 kg/m  , BMI Body mass index is 33.44 kg/m. GEN: Well nourished, well developed, in no acute distress HEENT: normal Neck: no JVD, carotid bruits, or masses; bandage on anterior neck over prior trach site Cardiac: ; no murmurs, rubs, or gallops,no edema  Respiratory:  clear to auscultation bilaterally, normal work of breathing GI: soft, nontender, nondistended, + BS MS: no deformity or atrophy Skin: warm and dry, no rash Neuro:  Strength and sensation are intact Psych: euthymic mood, full affect   EKG:   The ekg  ordered today demonstrates sinus bradycardia, nonspecific ST-T wave changes   Recent Labs: 07/13/2021: ALT 8; BUN 16; Creatinine, Ser 0.84; NT-Pro BNP 278; Potassium 4.3; Sodium 138; TSH 1.620 09/29/2021: Hemoglobin 13.6; Platelets 236.0   Lipid Panel    Component Value Date/Time   CHOL 203 (H) 06/18/2019 1411   TRIG 73 06/18/2019 1411   HDL 83 06/18/2019 1411   CHOLHDL 2.4 06/18/2019 1411   LDLCALC 107 (H) 06/18/2019 1411     Other studies Reviewed: Additional studies/ records that were reviewed today with results demonstrating: normal renal function.   ASSESSMENT AND PLAN:  AFib: In NSR.  Continue current meds. On low dose amiodarone.  Cardiomyopathy: resolved.  Bradycardia: No dizziness, lightheadedness, passing out spells.  Hypertensive heart disease: The current medical regimen is effective;  continue present plan and medications. Anticoagulated: Xarelto for stroke prevention.     Current medicines are reviewed at length with the patient today.  The patient concerns regarding her medicines were addressed.  The following changes have been made:  No change  Labs/ tests ordered today include:  No orders of the defined types were placed in this encounter.   Recommend 150 minutes/week of aerobic exercise Low fat, low carb, high fiber diet recommended  Disposition:   FU in 1 year   Signed, Larae Grooms, MD  06/25/2022 4:49 PM    [facility] [place], [place], [place]  [address] Phone: [phone]; Fax: [phone]

## 2022-06-25 ENCOUNTER — Encounter: Payer: Self-pay | Admitting: Interventional Cardiology

## 2022-06-25 ENCOUNTER — Ambulatory Visit: Payer: Medicare PPO | Attending: Interventional Cardiology | Admitting: Interventional Cardiology

## 2022-06-25 VITALS — BP 136/68 | HR 46 | Ht 61.0 in | Wt 177.0 lb

## 2022-06-25 DIAGNOSIS — R001 Bradycardia, unspecified: Secondary | ICD-10-CM

## 2022-06-25 DIAGNOSIS — I480 Paroxysmal atrial fibrillation: Secondary | ICD-10-CM | POA: Diagnosis not present

## 2022-06-25 DIAGNOSIS — I119 Hypertensive heart disease without heart failure: Secondary | ICD-10-CM | POA: Diagnosis not present

## 2022-06-25 DIAGNOSIS — I420 Dilated cardiomyopathy: Secondary | ICD-10-CM | POA: Diagnosis not present

## 2022-06-25 DIAGNOSIS — Z7901 Long term (current) use of anticoagulants: Secondary | ICD-10-CM

## 2022-06-25 NOTE — Patient Instructions (Signed)
Medication Instructions:  Your physician recommends that you continue on your current medications as directed. Please refer to the Current Medication list given to you today.  *If you need a refill on your cardiac medications before your next appointment, please call your pharmacy*   Lab Work: Your physician recommends that you return for lab work on 06/28/22--CBC, CMET, TSH.  This is not fasting.  The lab is open from 7:15 AM to 4:30 PM  If you have labs (blood work) drawn today and your tests are completely normal, you will receive your results only by: [place] (if you have MyChart) OR A paper copy in the mail If you have any lab test that is abnormal or we need to change your treatment, we will call you to review the results.   Testing/Procedures: none   Follow-Up: At [facility], you and your health needs are our priority.  As part of our continuing mission to provide you with exceptional heart care, we have created designated Provider Care Teams.  These Care Teams include your primary Cardiologist (physician) and Advanced Practice Providers (APPs -  Physician Assistants and Nurse Practitioners) who all work together to provide you with the care you need, when you need it.  We recommend signing up for the patient portal called "MyChart".  Sign up information is provided on this After Visit Summary.  MyChart is used to connect with patients for Virtual Visits (Telemedicine).  Patients are able to view lab/test results, encounter notes, upcoming appointments, etc.  Non-urgent messages can be sent to your provider as well.   To learn more about what you can do with MyChart, go to [email or web address].    Your next appointment:   6 month(s)  Provider:   Nicholes Rough, PA-C, Melina Copa, PA-C, Ambrose Pancoast, NP, Cecilie Kicks, NP, Christen Bame, NP, or Richardson Dopp, PA-C     Then, Larae Grooms, MD will plan to see you again in 12 month(s).    Other Instructions

## 2022-06-28 ENCOUNTER — Ambulatory Visit: Payer: Medicare PPO | Attending: Interventional Cardiology

## 2022-06-28 DIAGNOSIS — Z7901 Long term (current) use of anticoagulants: Secondary | ICD-10-CM

## 2022-06-28 DIAGNOSIS — I480 Paroxysmal atrial fibrillation: Secondary | ICD-10-CM

## 2022-06-28 DIAGNOSIS — R001 Bradycardia, unspecified: Secondary | ICD-10-CM

## 2022-06-28 DIAGNOSIS — I420 Dilated cardiomyopathy: Secondary | ICD-10-CM

## 2022-06-28 DIAGNOSIS — I119 Hypertensive heart disease without heart failure: Secondary | ICD-10-CM

## 2022-06-28 NOTE — Addendum Note (Signed)
Addended by: Sharee Holster R on: 06/28/2022 10:17 AM   Modules accepted: Orders

## 2022-06-29 LAB — COMPREHENSIVE METABOLIC PANEL
ALT: 8 IU/L (ref 0–32)
AST: 13 IU/L (ref 0–40)
Albumin/Globulin Ratio: 2 (ref 1.2–2.2)
Albumin: 4 g/dL (ref 3.8–4.8)
Alkaline Phosphatase: 72 IU/L (ref 44–121)
BUN/Creatinine Ratio: 15 (ref 12–28)
BUN: 11 mg/dL (ref 8–27)
Bilirubin Total: 0.3 mg/dL (ref 0.0–1.2)
CO2: 20 mmol/L (ref 20–29)
Calcium: 8.9 mg/dL (ref 8.7–10.3)
Chloride: 104 mmol/L (ref 96–106)
Creatinine, Ser: 0.72 mg/dL (ref 0.57–1.00)
Globulin, Total: 2 g/dL (ref 1.5–4.5)
Glucose: 101 mg/dL — ABNORMAL HIGH (ref 70–99)
Potassium: 4.1 mmol/L (ref 3.5–5.2)
Sodium: 139 mmol/L (ref 134–144)
Total Protein: 6 g/dL (ref 6.0–8.5)
eGFR: 88 mL/min/{1.73_m2} (ref >59)

## 2022-06-29 LAB — CBC
Hematocrit: 38.7 % (ref 34.0–46.6)
Hemoglobin: 12.9 g/dL (ref 11.1–15.9)
MCH: 31.5 pg (ref 26.6–33.0)
MCHC: 33.3 g/dL (ref 31.5–35.7)
MCV: 94 fL (ref 79–97)
Platelets: 243 10*3/uL (ref 150–450)
RBC: 4.1 x10E6/uL (ref 3.77–5.28)
RDW: 13.5 % (ref 11.7–15.4)
WBC: 5.9 10*3/uL (ref 3.4–10.8)

## 2022-06-29 LAB — TSH: TSH: 1.82 u[IU]/mL (ref 0.450–4.500)

## 2022-07-05 ENCOUNTER — Telehealth: Payer: Self-pay

## 2022-07-05 NOTE — Telephone Encounter (Signed)
-----   Message from Jettie Booze, MD sent at 07/02/2022  4:54 PM EDT ----- Normal CMet, CBC and TSH

## 2022-07-05 NOTE — Telephone Encounter (Signed)
Patient verbalizes understanding of normal CMET, CBC and TSH results.

## 2022-07-17 ENCOUNTER — Other Ambulatory Visit: Payer: Self-pay | Admitting: Interventional Cardiology

## 2022-07-24 ENCOUNTER — Other Ambulatory Visit: Payer: Self-pay | Admitting: Nurse Practitioner

## 2022-07-24 DIAGNOSIS — J040 Acute laryngitis: Secondary | ICD-10-CM

## 2022-09-02 ENCOUNTER — Ambulatory Visit: Payer: Medicare PPO | Admitting: Student

## 2022-09-08 NOTE — Progress Notes (Unsigned)
Synopsis: Referred for DOE by Barbie Banner, MD  Subjective:   PATIENT ID: Stacey Caldwell GENDER: female DOB: [birth year], MRN: [id number]  No chief complaint on file.  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 Marene Lenz and was referred to Dr. Rubye Oaks at [facility] for treatment.   She has to keep a fan on her when she's walking around, has DOE with minimal exertion. She has occasional cough with throat tickle. Used to have severe GERD but symptomatically improved on protonix. No dysphagia/odynophagia. No rashes. Does have joint pain in her knees and stiff hands but no signficant morning stiffness, oral/nasal ulcers, raynaud's.   Still using symbicort 80 2 puffs twice daily.    No family history of autoimmune disease  Never smoker. She is an Airline pilot.   Interval HPI:  Underwent capping trial 2/7 at [facility] with decannulation, sees Dr. Rubye Oaks later this month.   Feeling '98%' better since laryngeal web addressed.   Stopped nebulized medications now that she's been decannulated, back to using symbicort 80 2 puff bid.   ---------------------------------- Has followed with ENT, last seen by Dr. Rubye Oaks 4/3 with good looking FNL, no evidence of recurrent laryngeal web.  Maintained on symbicort 80 last visit  Otherwise pertinent review of systems is negative.   Past Medical History:  Diagnosis Date   A-fib ([facility])    Anxiety    Depression    GERD (gastroesophageal reflux disease)    History of asthma    no meds. in 2 yr.   Hypertension    under control with med., has been on med. x 3-4 yr.   Lipoma of back 10/2012   Osteoarthritis    Pneumonia 04/2014   PONV (postoperative nausea and vomiting)    Stress incontinence      Family History  Problem Relation Age of Onset   Lung cancer Mother         and ETOH abuse   Lung cancer Father        and ETOH abuse   Throat cancer Brother        ETOH abuse     Past Surgical History:  Procedure Laterality Date   APPENDECTOMY     KNEE ARTHROPLASTY Right 2009   LIPOMA EXCISION Right 11/13/2012   Procedure: EXCISION LIPOMA;  Surgeon: Valarie Merino, MD;  Location: [facility];  Service: General;  Laterality: Right;   TONSILLECTOMY     age 72    Social History   Socioeconomic History   Marital status: Divorced    Spouse name: Not on file   Number of children: 0   Years of education: Not on file   Highest education level: Not on file  Occupational History   Not on file  Tobacco Use   Smoking status: Never   Smokeless tobacco: Never  Vaping Use   Vaping Use: Never used  Substance and Sexual Activity   Alcohol use: Yes    Comment: 2 a month   Drug use: No   Sexual activity: Not Currently    Partners: Male    Birth control/protection: Post-menopausal  Other Topics Concern   Not on file  Social History Narrative   Not on file   Social Determinants of Health   Financial Resource Strain: Not on file  Food Insecurity: Not on file  Transportation Needs: Not on file  Physical Activity: Not on file  Stress: Not on file  Social Connections: Not on file  Intimate Partner Violence: Not on file     Allergies  Allergen Reactions   Codeine Other (See Comments)    HEADACHE   Dextromethorphan Hbr Other (See Comments)    unspecified   Lisinopril Cough   Paroxetine Hcl Other (See Comments)    unspecified     Outpatient Medications Prior to Visit  Medication Sig Dispense Refill   acetaminophen (TYLENOL) 325 MG tablet Take by mouth.     albuterol (PROVENTIL) (2.5 MG/3ML) 0.083% nebulizer solution Take 3 mLs (2.5 mg total) by nebulization every 6 (six) hours as needed for wheezing or shortness of breath. (Patient not taking: Reported on 05/26/2022) 75 mL 12   albuterol (VENTOLIN HFA) 108 (90 Base) MCG/ACT  inhaler TAKE 2 PUFFS BY MOUTH EVERY 6 HOURS AS NEEDED FOR WHEEZE OR SHORTNESS OF BREATH 18 each 2   amiodarone (PACERONE) 200 MG tablet TAKE 1 TABLET BY MOUTH EVERY DAY 90 tablet 2   ascorbic acid (VITAMIN C) 500 MG tablet Take 1 tablet by mouth daily.     budesonide-formoterol (SYMBICORT) 80-4.5 MCG/ACT inhaler Inhale 2 puffs into the lungs in the morning and at bedtime. 1 each 12   CALCIUM & MAGNESIUM CARBONATES PO Take 2 capsules by mouth daily.     fluticasone (FLONASE) 50 MCG/ACT nasal spray SPRAY 2 SPRAYS INTO EACH NOSTRIL EVERY DAY 48 mL 2   furosemide (LASIX) 40 MG tablet TAKE 1 TABLET BY MOUTH EVERY DAY 90 tablet 2   loratadine (CLARITIN) 10 MG tablet TAKE 1 TABLET BY MOUTH EVERY DAY 90 tablet 3   Melatonin 5 MG TABS Take 5 mg by mouth every evening.     metoprolol succinate (TOPROL-XL) 25 MG 24 hr tablet TAKE 1 TABLET (25 MG TOTAL) BY MOUTH DAILY. 90 tablet 3   NUTRITIONAL SUPPLEMENTS PO Take 1 Dose by mouth daily. Serpeptase Enzymes     pantoprazole (PROTONIX) 40 MG tablet TAKE 1 TABLET BY MOUTH EVERY DAY 90 tablet 1   saccharomyces boulardii (FLORASTOR) 250 MG capsule Take by mouth.     valsartan (DIOVAN) 160 MG tablet Take 1 tablet (160 mg total) by mouth daily. 90 tablet 3   venlafaxine XR (EFFEXOR-XR) 37.5 MG 24 hr capsule Take 1 capsule by mouth as directed.     XARELTO 20 MG TABS tablet TAKE 1 TABLET BY MOUTH DAILY WITH SUPPER 90 tablet 1   Zinc Gluconate 100 MG TABS Take by mouth.     No facility-administered medications prior to visit.       Objective:   Physical Exam:  General appearance: 72 y.o., female, NAD, conversant  Eyes: anicteric sclerae; PERRL, tracking appropriately HENT: NCAT;  Lungs: ctab with normal respiratory effort, clear strong voice CV: brady RR, no murmur  Abdomen: Soft, non-tender; non-distended, BS present  Extremities: No peripheral edema, warm Skin: Normal turgor and texture; no rash Psych: Appropriate affect Neuro: Alert and oriented to  person and place, no focal deficit       There were no vitals filed for this visit.      on RA BMI Readings from Last 3 Encounters:  06/25/22 33.44 kg/m  05/26/22 33.03 kg/m  04/09/22 33.33 kg/m   Wt Readings from Last 3 Encounters:  06/25/22 177 lb (80.3 kg)  05/26/22 174 lb 12.8 oz (79.3 kg)  04/09/22 176 lb 6.4 oz (80 kg)  CBC    Component Value Date/Time   WBC 5.9 06/28/2022 1548   WBC 6.6 09/29/2021 1627   RBC 4.10 06/28/2022 1548   RBC 4.32 09/29/2021 1627   HGB 12.9 06/28/2022 1548   HCT 38.7 06/28/2022 1548   PLT 243 06/28/2022 1548   MCV 94 06/28/2022 1548   MCH 31.5 06/28/2022 1548   MCHC 33.3 06/28/2022 1548   MCHC 33.3 09/29/2021 1627   RDW 13.5 06/28/2022 1548   LYMPHSABS 1.3 09/29/2021 1627   MONOABS 0.7 09/29/2021 1627   EOSABS 0.1 09/29/2021 1627   BASOSABS 0.0 09/29/2021 1627    Chest Imaging: HRCT 11/13/21 reviewed by me unremarkable   Pulmonary Functions Testing Results:    Latest Ref Rng & Units 09/01/2021    3:46 PM  PFT Results  FVC-Pre L 2.02   FVC-Predicted Pre % 78   FVC-Post L 1.85   FVC-Predicted Post % 71   Pre FEV1/FVC % % 34   Post FEV1/FCV % % 48   FEV1-Pre L 0.69   FEV1-Predicted Pre % 35   FEV1-Post L 0.89   DLCO uncorrected ml/min/mmHg 26.14   DLCO UNC% % 150   DLCO corrected ml/min/mmHg 25.46   DLCO COR %Predicted % 146   DLVA Predicted % 124   TLC L 5.09   TLC % Predicted % 110   RV % Predicted % 144    PFT 09/01/21 reviewed by me with severe obstruction, ++BD response, air trapping, elevated diffusing capacity, likely fixed upper airway obstruction   Echocardiogram 12/18/21:    1. Left ventricular ejection fraction, by estimation, is 60 to 65%. Left  ventricular ejection fraction by PLAX is 61 %. The left ventricle has  normal function. The left ventricle has no regional wall motion  abnormalities. There is moderate asymmetric  left ventricular hypertrophy of the basal-septal segment. Left ventricular   diastolic parameters are consistent with Grade II diastolic dysfunction  (pseudonormalization).   2. Right ventricular systolic function is normal. The right ventricular  size is normal. There is normal pulmonary artery systolic pressure.   3. Left atrial size was severely dilated.   4. Right atrial size was mildly dilated.   5. The mitral valve is normal in structure. Trivial mitral valve  regurgitation. No evidence of mitral stenosis.   6. Tricuspid valve regurgitation is moderate.   7. The aortic valve is normal in structure. Aortic valve regurgitation is  not visualized. No aortic stenosis is present.   8. Aortic dilatation noted. There is mild dilatation of the ascending  aorta, measuring 41 mm.   9. The inferior vena cava is normal in size with greater than 50%  respiratory variability, suggesting right atrial pressure of 3 mmHg.      Assessment & Plan:   # central airway obstruction with laryngeal web, ?subglottic stenosis s/p tracheal dilation, tracheostomy s/p decannulation 2/8  # DOE # Possible moderate persistent asthma Seems like the bulk of this was related to her above issues  Plan: - would continue claritin, flonase, protonix - symbicort 2 puffs twice daily rinse mouth and brush tongue/teeth after each use - albuterol 1-2 puffs as needed and can use 5- 20 minutes before exercise  - we'll see you in 3 months or sooner if need be!     Omar Person, MD [facility] 09/08/2022 7:03 PM

## 2022-09-09 ENCOUNTER — Encounter: Payer: Self-pay | Admitting: Student

## 2022-09-09 ENCOUNTER — Ambulatory Visit: Payer: Medicare PPO | Admitting: Student

## 2022-09-09 VITALS — BP 128/72 | HR 60 | Temp 97.7°F | Ht 61.0 in | Wt 181.0 lb

## 2022-09-09 DIAGNOSIS — J4540 Moderate persistent asthma, uncomplicated: Secondary | ICD-10-CM | POA: Diagnosis not present

## 2022-09-09 NOTE — Patient Instructions (Addendum)
-   symbicort 2 puffs twice daily rinse mouth and brush tongue/teeth after each use. Stop this 3 weeks before your PFTs  - I think it's reasonable to stop the pantoprazole - PFTs in 3 months on same day as next visit with Dr. Francine Graven - albuterol 1-2 puffs as needed and can use 5- 20 minutes before exercise

## 2022-10-03 ENCOUNTER — Other Ambulatory Visit: Payer: Self-pay | Admitting: Interventional Cardiology

## 2022-10-03 DIAGNOSIS — I480 Paroxysmal atrial fibrillation: Secondary | ICD-10-CM

## 2022-10-04 NOTE — Telephone Encounter (Signed)
Prescription refill request for Xarelto received.  Indication:Afib Last office visit:3/24 Weight:82.1 kg Age:72 Scr:0.72  3/24 CrCl:90.19  ml/min  Prescription refilled

## 2022-10-18 ENCOUNTER — Other Ambulatory Visit: Payer: Self-pay | Admitting: Interventional Cardiology

## 2022-10-18 DIAGNOSIS — I119 Hypertensive heart disease without heart failure: Secondary | ICD-10-CM

## 2022-11-14 ENCOUNTER — Other Ambulatory Visit: Payer: Self-pay | Admitting: Nurse Practitioner

## 2022-11-24 ENCOUNTER — Ambulatory Visit: Payer: Medicare PPO | Admitting: Pulmonary Disease

## 2022-11-24 ENCOUNTER — Encounter: Payer: Self-pay | Admitting: Pulmonary Disease

## 2022-11-24 ENCOUNTER — Ambulatory Visit (INDEPENDENT_AMBULATORY_CARE_PROVIDER_SITE_OTHER): Payer: Medicare PPO | Admitting: Pulmonary Disease

## 2022-11-24 VITALS — BP 112/74 | HR 82 | Temp 97.2°F | Ht 61.0 in | Wt 182.8 lb

## 2022-11-24 DIAGNOSIS — J4540 Moderate persistent asthma, uncomplicated: Secondary | ICD-10-CM | POA: Diagnosis not present

## 2022-11-24 DIAGNOSIS — R0609 Other forms of dyspnea: Secondary | ICD-10-CM | POA: Diagnosis not present

## 2022-11-24 LAB — PULMONARY FUNCTION TEST
DL/VA % pred: 107 %
DL/VA: 4.55 ml/min/mmHg/L
DLCO cor % pred: 98 %
DLCO cor: 17 ml/min/mmHg
DLCO unc % pred: 98 %
DLCO unc: 17 ml/min/mmHg
FEF 25-75 Pre: 2.23 L/s
FEF2575-%Pred-Pre: 140 %
FEV1-%Pred-Pre: 84 %
FEV1-Pre: 1.6 L
FEV1FVC-%Pred-Pre: 132 %
FEV6-%Pred-Pre: 66 %
FEV6-Pre: 1.6 L
FEV6FVC-%Pred-Pre: 105 %
FVC-%Pred-Pre: 63 %
FVC-Pre: 1.6 L
Pre FEV1/FVC ratio: 99 %
Pre FEV6/FVC Ratio: 100 %

## 2022-11-24 NOTE — Patient Instructions (Signed)
Spiro/DLCO performed today. 

## 2022-11-24 NOTE — Progress Notes (Signed)
Spiro/DLCO performed today. 

## 2022-11-24 NOTE — Progress Notes (Signed)
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 Marene Lenz and was referred to Dr. Rubye Oaks at [facility] for treatment.    She has to keep a fan on her when she's walking around, has DOE with minimal exertion. She has occasional cough with throat tickle. Used to have severe GERD but symptomatically improved on protonix. No dysphagia/odynophagia. No rashes. Does have joint pain in her knees and stiff hands but no signficant morning stiffness, oral/nasal ulcers, raynaud's.    Still using symbicort 80 2 puffs twice daily.     No family history of autoimmune disease   Never smoker. She is an Airline pilot.    OV - Today: 11/24/22 She has been doing ok since last visit  She had trach, took it out in February. She feels like her trach site is strangling her, saw ENT on 10/13/22 with now issues noted on flexible laryngoscopy.   PFTs today are within normal limits and flow/volume loops have normalized compared to prior PFTs.   Past Medical History:  Diagnosis Date   A-fib (HCC)    Anxiety    Depression    GERD (gastroesophageal reflux disease)    History of asthma    no meds. in 2 yr.   Hypertension    under control with med., has been on med. x 3-4 yr.   Lipoma of back 10/2012   Osteoarthritis    Pneumonia 04/2014   PONV (postoperative nausea and vomiting)    Stress incontinence      Family History  Problem Relation Age of Onset   Lung cancer Mother        and ETOH abuse   Lung cancer Father        and ETOH abuse   Throat cancer Brother         ETOH abuse     Social History   Socioeconomic History   Marital status: Divorced    Spouse name: Not on file   Number of children: 0   Years of education: Not on file   Highest education level: Not on file  Occupational History   Not on file  Tobacco Use   Smoking status: Never   Smokeless tobacco: Never  Vaping Use   Vaping status: Never Used  Substance and Sexual Activity   Alcohol use: Yes    Comment: 2 a month   Drug use: No   Sexual activity: Not Currently    Partners: Male    Birth control/protection: Post-menopausal  Other Topics Concern   Not on file  Social History Narrative   Not on file   Social Determinants of Health   Financial Resource Strain: Not on file  Food Insecurity: Low Risk  (05/20/2022)   [facility]   Food vital sign    Within the past 12 months, you worried that your food would run out before you got money to buy more: Never true    Within the past 12 months, the food you bought just didn't last and  you didn't have money to get more: Not on file  Transportation Needs: No Transportation Needs (05/20/2022)   [facility]   Transportation    In the past 12 months, has lack of reliable transportation kept you from medical appointments, meetings, work or from getting things needed for daily living? : No  Physical Activity: Not on file  Stress: Not on file  Social Connections: Not on file  Intimate Partner Violence: Low Risk  (05/20/2022)   [facility] from [facility] visits prior to 06/12/2022., [facility] visits prior to 06/12/2022.   Safety    How often does anyone, including family and friends, physically hurt you?: Never    How often does anyone, including family and friends, insult or talk down to you?: Never    How often does anyone, including family and friends, threaten you with harm?: Never    How often does anyone, including family and friends,  scream or curse at you?: Never     Allergies  Allergen Reactions   Codeine Other (See Comments)    HEADACHE   Dextromethorphan Hbr Other (See Comments)    unspecified   Lisinopril Cough   Paroxetine Hcl Other (See Comments)    unspecified     Outpatient Medications Prior to Visit  Medication Sig Dispense Refill   acetaminophen (TYLENOL) 325 MG tablet Take by mouth.     albuterol (VENTOLIN HFA) 108 (90 Base) MCG/ACT inhaler TAKE 2 PUFFS BY MOUTH EVERY 6 HOURS AS NEEDED FOR WHEEZE OR SHORTNESS OF BREATH 18 each 2   amiodarone (PACERONE) 200 MG tablet TAKE 1 TABLET BY MOUTH EVERY DAY 90 tablet 2   ascorbic acid (VITAMIN C) 500 MG tablet Take 1 tablet by mouth daily.     CALCIUM & MAGNESIUM CARBONATES PO Take 2 capsules by mouth daily.     fluticasone (FLONASE) 50 MCG/ACT nasal spray SPRAY 2 SPRAYS INTO EACH NOSTRIL EVERY DAY 48 mL 2   furosemide (LASIX) 40 MG tablet TAKE 1 TABLET BY MOUTH EVERY DAY 90 tablet 2   loratadine (CLARITIN) 10 MG tablet TAKE 1 TABLET BY MOUTH EVERY DAY 90 tablet 3   Melatonin 5 MG TABS Take 5 mg by mouth every evening.     metoprolol succinate (TOPROL-XL) 25 MG 24 hr tablet TAKE 1 TABLET (25 MG TOTAL) BY MOUTH DAILY. 90 tablet 3   NUTRITIONAL SUPPLEMENTS PO Take 1 Dose by mouth daily. Serpeptase Enzymes     saccharomyces boulardii (FLORASTOR) 250 MG capsule Take by mouth.     SYMBICORT 80-4.5 MCG/ACT inhaler INHALE 2 PUFFS INTO THE LUNGS IN THE MORNING AND AT BEDTIME. 30.6 each 4   valsartan (DIOVAN) 160 MG tablet TAKE 1 TABLET BY MOUTH EVERY DAY 90 tablet 1   venlafaxine XR (EFFEXOR-XR) 37.5 MG 24 hr capsule Take 1 capsule by mouth as directed.     XARELTO 20 MG TABS tablet TAKE 1 TABLET BY MOUTH DAILY WITH SUPPER 90 tablet 1   Zinc Gluconate 100 MG TABS Take by mouth.     pantoprazole (PROTONIX) 40 MG tablet TAKE 1 TABLET BY MOUTH EVERY DAY (Patient not taking: Reported on 11/24/2022) 90 tablet 1   No facility-administered medications prior to visit.     Review of Systems  Constitutional:  Negative for chills, fever, malaise/fatigue and weight loss.  HENT:  Negative for congestion, sinus pain and sore throat.   Eyes: Negative.   Respiratory:  Positive for cough and  shortness of breath. Negative for hemoptysis, sputum production and wheezing.   Cardiovascular:  Negative for chest pain, palpitations, orthopnea, claudication and leg swelling.  Gastrointestinal:  Negative for abdominal pain, heartburn, nausea and vomiting.  Genitourinary: Negative.   Musculoskeletal:  Negative for joint pain and myalgias.  Skin:  Negative for rash.  Neurological:  Negative for weakness.  Endo/Heme/Allergies: Negative.   Psychiatric/Behavioral: Negative.     Objective:   Vitals:   11/24/22 1510  BP: 112/74  Pulse: 82  Temp: (!) 97.2 F (36.2 C)  TempSrc: Temporal  SpO2: 98%  Weight: 182 lb 12.8 oz (82.9 kg)  Height: 5\' 1"  (1.549 m)   Physical Exam Constitutional:      General: She is not in acute distress.    Appearance: Normal appearance.  Eyes:     General: No scleral icterus.    Conjunctiva/sclera: Conjunctivae normal.  Cardiovascular:     Rate and Rhythm: Normal rate and regular rhythm.  Pulmonary:     Breath sounds: No wheezing, rhonchi or rales.  Musculoskeletal:     Right lower leg: No edema.     Left lower leg: No edema.  Skin:    General: Skin is warm and dry.  Neurological:     General: No focal deficit present.    CBC    Component Value Date/Time   WBC 5.9 06/28/2022 1548   WBC 6.6 09/29/2021 1627   RBC 4.10 06/28/2022 1548   RBC 4.32 09/29/2021 1627   HGB 12.9 06/28/2022 1548   HCT 38.7 06/28/2022 1548   PLT 243 06/28/2022 1548   MCV 94 06/28/2022 1548   MCH 31.5 06/28/2022 1548   MCHC 33.3 06/28/2022 1548   MCHC 33.3 09/29/2021 1627   RDW 13.5 06/28/2022 1548   LYMPHSABS 1.3 09/29/2021 1627   MONOABS 0.7 09/29/2021 1627   EOSABS 0.1 09/29/2021 1627   BASOSABS 0.0 09/29/2021 1627      Latest Ref Rng &  Units 06/28/2022    3:48 PM 07/13/2021    3:48 PM 06/24/2020    3:49 PM  BMP  Glucose 70 - 99 mg/dL 259  563  84   BUN 8 - 27 mg/dL 11  16  15    Creatinine 0.57 - 1.00 mg/dL 8.75  6.43  3.29   BUN/Creat Ratio 12 - 28 15  19  20    Sodium 134 - 144 mmol/L 139  138  139   Potassium 3.5 - 5.2 mmol/L 4.1  4.3  4.6   Chloride 96 - 106 mmol/L 104  100  100   CO2 20 - 29 mmol/L 20  23  22    Calcium 8.7 - 10.3 mg/dL 8.9  9.4  9.1    Chest imaging: CXR 05/10/22 Tracheostomy in place. Heart size is normal. Mild tortuosity of the aorta. The lungs are clear. The vascularity is normal. No infiltrate, collapse or effusion. No abnormal bone finding.   PFT:    Latest Ref Rng & Units 11/24/2022    1:59 PM 09/01/2021    3:46 PM  PFT Results  FVC-Pre L 1.60  P 2.02   FVC-Predicted Pre % 63  P 78   FVC-Post L  1.85   FVC-Predicted Post %  71   Pre FEV1/FVC % % 99  P 34   Post FEV1/FCV % %  48   FEV1-Pre L 1.60  P 0.69   FEV1-Predicted Pre % 84  P 35   FEV1-Post L  0.89   DLCO uncorrected  ml/min/mmHg 17.00  P 26.14   DLCO UNC% % 98  P 150   DLCO corrected ml/min/mmHg 17.00  P 25.46   DLCO COR %Predicted % 98  P 146   DLVA Predicted % 107  P 124   TLC L  5.09   TLC % Predicted %  110   RV % Predicted %  144     P Preliminary result    Labs:  Path:  Echo:  Heart Catheterization:    Assessment & Plan:   Moderate persistent asthma without complication  Dyspnea on exertion  Discussion: Stacey Caldwell is a 72 year old  woman, history of pAF, diastolic dysfunction, HTN, GERD, moderate persistent asthma and 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   Her PFTs have normalized based on todays results. She continues to have sensation of something in her airway but recent ENT visit does not indicate recurrence of web formation.  She is to continue symbicort 2 puffs twice daily for her asthma.   Follow up in 1  year, call sooner if needed.  Melody Comas, MD [facility] Office: [phone]   Current Outpatient Medications:    acetaminophen (TYLENOL) 325 MG tablet, Take by mouth., Disp: , Rfl:    albuterol (VENTOLIN HFA) 108 (90 Base) MCG/ACT inhaler, TAKE 2 PUFFS BY MOUTH EVERY 6 HOURS AS NEEDED FOR WHEEZE OR SHORTNESS OF BREATH, Disp: 18 each, Rfl: 2   amiodarone (PACERONE) 200 MG tablet, TAKE 1 TABLET BY MOUTH EVERY DAY, Disp: 90 tablet, Rfl: 2   ascorbic acid (VITAMIN C) 500 MG tablet, Take 1 tablet by mouth daily., Disp: , Rfl:    CALCIUM & MAGNESIUM CARBONATES PO, Take 2 capsules by mouth daily., Disp: , Rfl:    fluticasone (FLONASE) 50 MCG/ACT nasal spray, SPRAY 2 SPRAYS INTO EACH NOSTRIL EVERY DAY, Disp: 48 mL, Rfl: 2   furosemide (LASIX) 40 MG tablet, TAKE 1 TABLET BY MOUTH EVERY DAY, Disp: 90 tablet, Rfl: 2   loratadine (CLARITIN) 10 MG tablet, TAKE 1 TABLET BY MOUTH EVERY DAY, Disp: 90 tablet, Rfl: 3   Melatonin 5 MG TABS, Take 5 mg by mouth every evening., Disp: , Rfl:    metoprolol succinate (TOPROL-XL) 25 MG 24 hr tablet, TAKE 1 TABLET (25 MG TOTAL) BY MOUTH DAILY., Disp: 90 tablet, Rfl: 3   NUTRITIONAL SUPPLEMENTS PO, Take 1 Dose by mouth daily. Serpeptase Enzymes, Disp: , Rfl:    saccharomyces boulardii (FLORASTOR) 250 MG capsule, Take by mouth., Disp: , Rfl:    SYMBICORT 80-4.5 MCG/ACT inhaler, INHALE 2 PUFFS INTO THE LUNGS IN THE MORNING AND AT BEDTIME., Disp: 30.6 each, Rfl: 4   valsartan (DIOVAN) 160 MG tablet, TAKE 1 TABLET BY MOUTH EVERY DAY, Disp: 90 tablet, Rfl: 1   venlafaxine XR (EFFEXOR-XR) 37.5 MG 24 hr capsule, Take 1 capsule by mouth as directed., Disp: , Rfl:    XARELTO 20 MG TABS tablet, TAKE 1 TABLET BY MOUTH DAILY WITH SUPPER, Disp: 90 tablet, Rfl: 1   Zinc Gluconate 100 MG TABS, Take by mouth., Disp: , Rfl:    pantoprazole (PROTONIX) 40 MG tablet, TAKE 1 TABLET BY MOUTH EVERY DAY (Patient not taking: Reported on 11/24/2022), Disp: 90 tablet,  Rfl: 1

## 2022-11-24 NOTE — Patient Instructions (Addendum)
Continue using symbicort 80-4.51mcg 2 puffs twice daily - rinse mouth out after each use  Your breathing tests overall look normal today  Continue follow up with ENT for the upper airway   Follow up in 1 year, call sooner if needed

## 2022-11-28 ENCOUNTER — Encounter: Payer: Self-pay | Admitting: Pulmonary Disease

## 2022-12-13 NOTE — Progress Notes (Unsigned)
Cardiology Office Note    Patient Name: Stacey Caldwell Date of Encounter: 12/14/2022  Primary Care Provider:  Barbie Banner, MD Primary Cardiologist:  Lance Muss, MD Primary Electrophysiologist: None   Past Medical History    Past Medical History:  Diagnosis Date   A-fib [facility])    Anxiety    Depression    GERD (gastroesophageal reflux disease)    History of asthma    no meds. in 2 yr.   Hypertension    under control with med., has been on med. x 3-4 yr.   Lipoma of back 10/2012   Osteoarthritis    Pneumonia 04/2014   PONV (postoperative nausea and vomiting)    Stress incontinence     History of Present Illness  Stacey Caldwell is a 72 y.o. female with a PMH of essential hypertension, mild aortic dilation HFrEF, paroxysmal AF (on Xarelto), GERD who presents today for 66-month follow-up.  Ms. Wobig was last seen by Dr. Eldridge Dace on 06/25/2022 for follow-up visit.  Visit she was doing well with no complaints of palpitations, or dyspnea on exertion.  2D echo was completed last 12/2021 with EF of 60-65% with moderate asymmetric LVH and grade 2 DD normal RV systolic function and severely dilated LA and mildly dilated RA with trivial MVR and mild aortic dilation (41 mm).  Her last ischemic evaluation was 2017 by myocardial perfusion test that showed no evidence of ischemia.  During today's visit the patient reports they are feeling well with no new cardiac complaints.  Patient does report that her heart rate has been staying low and today was 49 bpm and blood pressure was 104/60.  She is currently experiencing some lightheadedness and denies any syncope or presyncope.  She is compliant with her current medications and denies any adverse reactions.  She is staying active and performs chair exercises.  She is also currently following a keto diet.  Patient denies chest pain, palpitations, dyspnea, PND, orthopnea, nausea, vomiting, dizziness, syncope, edema, weight gain, or early  satiety.   Review of Systems  Please see the history of present illness.    All other systems reviewed and are otherwise negative except as noted above.  Physical Exam    Wt Readings from Last 3 Encounters:  12/14/22 183 lb (83 kg)  11/24/22 182 lb 12.8 oz (82.9 kg)  09/09/22 181 lb (82.1 kg)   VS: Vitals:   12/14/22 1540  BP: 104/60  Pulse: (!) 49  SpO2: 97%  ,Body mass index is 34.58 kg/m. GEN: Well nourished, well developed in no acute distress Neck: No JVD; No carotid bruits Pulmonary: Clear to auscultation without rales, wheezing or rhonchi  Cardiovascular: Normal rate. Regular rhythm. Normal S1. Normal S2.   Murmurs: There is no murmur.  ABDOMEN: Soft, non-tender, non-distended EXTREMITIES:  No edema; No deformity   EKG/LABS/ Recent Cardiac Studies   ECG personally reviewed by me today -none completed today      Lab Results  Component Value Date   WBC 5.9 06/28/2022   HGB 12.9 06/28/2022   HCT 38.7 06/28/2022   MCV 94 06/28/2022   PLT 243 06/28/2022   Lab Results  Component Value Date   CREATININE 0.72 06/28/2022   BUN 11 06/28/2022   NA 139 06/28/2022   K 4.1 06/28/2022   CL 104 06/28/2022   CO2 20 06/28/2022   Lab Results  Component Value Date   CHOL 203 (H) 06/18/2019   HDL 83 06/18/2019   LDLCALC 107 (  H) 06/18/2019   TRIG 73 06/18/2019   CHOLHDL 2.4 06/18/2019    No results found for: "HGBA1C" Assessment & Plan    1.  Paroxysmal AF: -Patient is currently sinus bradycardia with no complaints of tachycardia -She reports lightheadedness and Toprol-XL will be reduced to 12.5 mg daily -Continue amiodarone 20 mg daily -Patient's creatinine clearance is 92 -Continue Xarelto 20 mg daily  2.  HFrEF/dilated CM: -Previous 2D echo completed 12/2021 showing normal EF of 60 to 65%, severely dilated LA related RA LVH with grade 2 DD -Patient is euvolemic on examination today -Continue Toprol XL 12.5 mg daily, Lasix 40 mg daily, valsartan 160 mg  daily -Low sodium diet, fluid restriction <2L, and daily weights encouraged. Educated to contact our office for weight gain of 2 lbs overnight or 5 lbs in one week.   3.  Essential hypertension: -BP well-controlled at 104/60 -Continue valsartan 160 mg daily and Toprol XL 12.5 mg  4.  Bradycardia: -Patient reports symptomatic bradycardia with lightheadedness but denies any presyncope -We will reduce Toprol-XL to 12.5 mg today. -Patient was encouraged to maintain good hydration and contact us if she continues to experience lightheadedness.  Disposition: Follow-up with Lance Muss, MD or APP in 1 months    Signed, Napoleon Form, Leodis Rains, NP 12/14/2022, 4:01 PM [facility]

## 2022-12-14 ENCOUNTER — Ambulatory Visit: Payer: Medicare PPO | Attending: Nurse Practitioner | Admitting: Nurse Practitioner

## 2022-12-14 ENCOUNTER — Encounter: Payer: Self-pay | Admitting: Nurse Practitioner

## 2022-12-14 VITALS — BP 104/60 | HR 49 | Ht 61.0 in | Wt 183.0 lb

## 2022-12-14 DIAGNOSIS — I420 Dilated cardiomyopathy: Secondary | ICD-10-CM

## 2022-12-14 DIAGNOSIS — I480 Paroxysmal atrial fibrillation: Secondary | ICD-10-CM | POA: Diagnosis not present

## 2022-12-14 DIAGNOSIS — I519 Heart disease, unspecified: Secondary | ICD-10-CM

## 2022-12-14 DIAGNOSIS — I10 Essential (primary) hypertension: Secondary | ICD-10-CM

## 2022-12-14 MED ORDER — XARELTO 20 MG PO TABS: 20.0000 mg | ORAL_TABLET | Freq: Every day | ORAL | 3 refills | Status: DC

## 2022-12-14 NOTE — Patient Instructions (Signed)
Medication Instructions:  DECREASE Toprol XL to 12.5mg  Take 1 tablet once day  *If you need a refill on your cardiac medications before your next appointment, please call your pharmacy*   Lab Work: None ordered   Testing/Procedures: None ordered   Follow-Up: At [facility], you and your health needs are our priority.  As part of our continuing mission to provide you with exceptional heart care, we have created designated Provider Care Teams.  These Care Teams include your primary Cardiologist (physician) and Advanced Practice Providers (APPs -  Physician Assistants and Nurse Practitioners) who all work together to provide you with the care you need, when you need it.  We recommend signing up for the patient portal called "MyChart".  Sign up information is provided on this After Visit Summary.  MyChart is used to connect with patients for Virtual Visits (Telemedicine).  Patients are able to view lab/test results, encounter notes, upcoming appointments, etc.  Non-urgent messages can be sent to your provider as well.   To learn more about what you can do with MyChart, go to [email or web address].    Your next appointment:   1 month(s)  Provider:   Robin Searing, NP       Other Instructions Please get some ted hose or compression stockings. They can be purchased at your local medical supply store, [employer], [employer] or [employer] fund[employer].  Put them on first thing in the morning and wear them during the day . Elevate your feet during the day and remove hose in the evening before bed.  Check your blood pressure daily for 2 weeks, then contact the office with your readings.  Make sure to check 2 hours after your medications.   AVOID these things for 30 minutes before checking your blood pressure: No Drinking caffeine. No Drinking alcohol. No Eating. No Smoking. No Exercising.  Five minutes before checking your blood pressure: Pee. Sit in a dining chair. Avoid sitting in a  soft couch or armchair. Be quiet. Do not talk.

## 2023-01-16 NOTE — Progress Notes (Unsigned)
Cardiology Office Note    Patient Name: AVERYANNA Caldwell Date of Encounter: 01/17/2023  Primary Care Provider:  Barbie Banner, MD Primary Cardiologist:  Donato Schultz, MD Primary Electrophysiologist: None   Past Medical History    Past Medical History:  Diagnosis Date   A-fib [facility])    Anxiety    Depression    GERD (gastroesophageal reflux disease)    History of asthma    no meds. in 2 yr.   Hypertension    under control with med., has been on med. x 3-4 yr.   Lipoma of back 10/2012   Osteoarthritis    Pneumonia 04/2014   PONV (postoperative nausea and vomiting)    Stress incontinence     History of Present Illness  Stacey Caldwell is a 72 y.o. female with a PMH of essential hypertension, mild aortic dilation HFrEF, paroxysmal AF (on Xarelto), GERD who presents today for 1 month follow-up.  Stacey Caldwell was seen last on 12/14/2022 for 86-month follow-up.  During her visit she was doing well with no new cardiac complaints.  She did report her heart rate remaining low in the high 40s to mid 50s. Her blood pressure however was stable and patient did note some lightheadedness but no syncope.  Her Toprol-XL was reduced to 12.5 mg to help with lightheadedness.  During today's visit the patient reports that she is feeling much better with decrease in Toprol-XL.  She reports improvement with lightheadedness and less dizziness.  Her blood pressure today was controlled at 120/60 and heart rate was 57 bpm.  She is tolerating her Xarelto without any adverse reactions.  She is currently down 2 pounds since her previous visit and is currently on a keto diet with intermittent fasting.  She is also staying active with her trampoline at home.  Patient denies chest pain, palpitations, dyspnea, PND, orthopnea, nausea, vomiting, dizziness, syncope, edema, weight gain, or early satiety.   Review of Systems  Please see the history of present illness.    All other systems reviewed and are otherwise negative  except as noted above.  Physical Exam    Wt Readings from Last 3 Encounters:  01/17/23 181 lb (82.1 kg)  12/14/22 183 lb (83 kg)  11/24/22 182 lb 12.8 oz (82.9 kg)   VS: Vitals:   01/17/23 1420  BP: 120/60  Pulse: (!) 57  SpO2: 96%  ,Body mass index is 34.2 kg/m. GEN: Well nourished, well developed in no acute distress Neck: No JVD; No carotid bruits Pulmonary: Clear to auscultation without rales, wheezing or rhonchi  Cardiovascular: Normal rate. Regular rhythm. Normal S1. Normal S2.   Murmurs: There is no murmur.  ABDOMEN: Soft, non-tender, non-distended EXTREMITIES:  No edema; No deformity   EKG/LABS/ Recent Cardiac Studies   ECG personally reviewed by me today -sinus bradycardia with rate of 53 bpm and no acute changes with incomplete RBBB  Risk Assessment/Calculations:    CHA2DS2-VASc Score = 5   This indicates a 7.2% annual risk of stroke. The patient's score is based upon: CHF History: 1 HTN History: 1 Diabetes History: 0 Stroke History: 0 Vascular Disease History: 1 Age Score: 1 Gender Score: 1         Lab Results  Component Value Date   WBC 5.9 06/28/2022   HGB 12.9 06/28/2022   HCT 38.7 06/28/2022   MCV 94 06/28/2022   PLT 243 06/28/2022   Lab Results  Component Value Date   CREATININE 0.72 06/28/2022   BUN  11 06/28/2022   NA 139 06/28/2022   K 4.1 06/28/2022   CL 104 06/28/2022   CO2 20 06/28/2022   Lab Results  Component Value Date   CHOL 203 (H) 06/18/2019   HDL 83 06/18/2019   LDLCALC 107 (H) 06/18/2019   TRIG 73 06/18/2019   CHOLHDL 2.4 06/18/2019    No results found for: "HGBA1C" Assessment & Plan    1.  Paroxysmal AF: -Patient is currently sinus bradycardia with no complaints of tachycardia and Toprol-XL was reduced to 12.5 mg at previous visit. -She reports no palpitations or sustained arrhythmias. -Continue amiodarone 20 mg daily -Patient's creatinine clearance is 92 -Continue Xarelto 20 mg daily   2.  HFrEF/dilated  CM: -Previous 2D echo completed 12/2021 showing normal EF of 60 to 65%, severely dilated LA related RA LVH with grade 2 DD -Patient is euvolemic on examination today. -Continue Toprol XL 12.5 mg daily, Lasix 40 mg daily, valsartan 160 mg daily -Low sodium diet, fluid restriction <2L, and daily weights encouraged. Educated to contact our office for weight gain of 2 lbs overnight or 5 lbs in one week.    3.  Essential hypertension: -BP well-controlled at 120/60-Continue valsartan 160 mg daily and Toprol XL 12.5 mg   4.  Bradycardia: -Patient reports improvement to bradycardia and lightheadedness with reduction and beta-blocker. -Continue Toprol-XL 12.5 mg -Patient was encouraged to maintain good hydration and contact us if she continues to experience lightheadedness. Disposition: Follow-up with Donato Schultz, MD or APP in 6 months   Signed, Napoleon Form, Leodis Rains, NP 01/17/2023, 5:58 PM [facility]

## 2023-01-17 ENCOUNTER — Encounter: Payer: Self-pay | Admitting: Nurse Practitioner

## 2023-01-17 ENCOUNTER — Ambulatory Visit: Payer: Medicare PPO | Attending: Nurse Practitioner | Admitting: Nurse Practitioner

## 2023-01-17 VITALS — BP 120/60 | HR 57 | Ht 61.0 in | Wt 181.0 lb

## 2023-01-17 DIAGNOSIS — I5020 Unspecified systolic (congestive) heart failure: Secondary | ICD-10-CM

## 2023-01-17 DIAGNOSIS — I10 Essential (primary) hypertension: Secondary | ICD-10-CM | POA: Diagnosis not present

## 2023-01-17 DIAGNOSIS — I480 Paroxysmal atrial fibrillation: Secondary | ICD-10-CM | POA: Diagnosis not present

## 2023-01-17 DIAGNOSIS — I420 Dilated cardiomyopathy: Secondary | ICD-10-CM

## 2023-01-17 DIAGNOSIS — R001 Bradycardia, unspecified: Secondary | ICD-10-CM

## 2023-01-17 NOTE — Patient Instructions (Signed)
Medication Instructions:  Your physician recommends that you continue on your current medications as directed. Please refer to the Current Medication list given to you today. UPDATED med list: metoprolol succinate (Toprol-XL) 12.5 mg by mouth once daily  *If you need a refill on your cardiac medications before your next appointment, please call your pharmacy*   Lab Work: CMP, MG  If you have labs (blood work) drawn today and your tests are completely normal, you will receive your results only by: MyChart Message (if you have MyChart) OR A paper copy in the mail If you have any lab test that is abnormal or we need to change your treatment, we will call you to review the results.   Testing/Procedures: NONE   Follow-Up: At [facility], you and your health needs are our priority.  As part of our continuing mission to provide you with exceptional heart care, we have created designated Provider Care Teams.  These Care Teams include your primary Cardiologist (physician) and Advanced Practice Providers (APPs -  Physician Assistants and Nurse Practitioners) who all work together to provide you with the care you need, when you need it.   Your next appointment:   6 month(s)  Provider:   Donato Schultz, MD

## 2023-01-18 LAB — COMPREHENSIVE METABOLIC PANEL
ALT: 13 [IU]/L (ref 0–32)
AST: 20 [IU]/L (ref 0–40)
Albumin: 4.2 g/dL (ref 3.8–4.8)
Alkaline Phosphatase: 80 [IU]/L (ref 44–121)
BUN/Creatinine Ratio: 15 (ref 12–28)
BUN: 11 mg/dL (ref 8–27)
Bilirubin Total: 0.3 mg/dL (ref 0.0–1.2)
CO2: 20 mmol/L (ref 20–29)
Calcium: 8.7 mg/dL (ref 8.7–10.3)
Chloride: 105 mmol/L (ref 96–106)
Creatinine, Ser: 0.73 mg/dL (ref 0.57–1.00)
Globulin, Total: 1.8 g/dL (ref 1.5–4.5)
Glucose: 91 mg/dL (ref 70–99)
Potassium: 3.7 mmol/L (ref 3.5–5.2)
Sodium: 142 mmol/L (ref 134–144)
Total Protein: 6 g/dL (ref 6.0–8.5)
eGFR: 86 mL/min/{1.73_m2} (ref >59)

## 2023-01-18 LAB — MAGNESIUM: Magnesium: 2.2 mg/dL (ref 1.6–2.3)

## 2023-01-19 ENCOUNTER — Other Ambulatory Visit: Payer: Self-pay | Admitting: Interventional Cardiology

## 2023-01-19 DIAGNOSIS — I119 Hypertensive heart disease without heart failure: Secondary | ICD-10-CM

## 2023-02-28 ENCOUNTER — Telehealth: Payer: Self-pay | Admitting: Pulmonary Disease

## 2023-02-28 NOTE — Telephone Encounter (Signed)
PT states when she came in for a visit on 04/09/22  to see [place] she presented a check for 40.00 for a copay. Something went wrong and it processed to ANOTHER pt's checking account and it bounced because that PT had NSF.  Stacey Caldwell was finally billed by [place] but not only are they charging her the 40.00 copay but the 25.00 bad check fee for an account that was not even hers. She says she had the money in the bank.   Please look into it and call her if you will. [facility] wants her to pay it and billing told her to call us. Her # is [phone] (She sleeps until 12:00 everyday. )

## 2023-03-02 NOTE — Telephone Encounter (Signed)
Spoke with Chasity with billing who states that this has been happening. States she will send this off to the appropriate party. Left voicemail for patient to call back to inform of this information.

## 2023-05-11 ENCOUNTER — Other Ambulatory Visit: Payer: Self-pay | Admitting: Interventional Cardiology

## 2023-06-30 ENCOUNTER — Other Ambulatory Visit: Payer: Self-pay | Admitting: Interventional Cardiology

## 2023-06-30 DIAGNOSIS — I119 Hypertensive heart disease without heart failure: Secondary | ICD-10-CM

## 2023-07-13 ENCOUNTER — Other Ambulatory Visit: Payer: Self-pay | Admitting: Interventional Cardiology

## 2023-07-13 MED ORDER — AMIODARONE HCL 200 MG PO TABS: 200.0000 mg | ORAL_TABLET | Freq: Every day | ORAL | 1 refills | Status: DC

## 2023-07-13 MED ORDER — FUROSEMIDE 40 MG PO TABS: 40.0000 mg | ORAL_TABLET | Freq: Every day | ORAL | 1 refills | Status: DC

## 2023-07-13 NOTE — Addendum Note (Signed)
 Addended by: Adriana Simas, Breiona Couvillon L on: 07/13/2023 03:51 PM   Modules accepted: Orders

## 2023-07-18 ENCOUNTER — Encounter: Payer: Self-pay | Admitting: Cardiology

## 2023-07-18 ENCOUNTER — Ambulatory Visit: Payer: Medicare PPO | Attending: Cardiology | Admitting: Cardiology

## 2023-07-18 VITALS — BP 126/74 | HR 115 | Ht 61.0 in | Wt 179.8 lb

## 2023-07-18 DIAGNOSIS — Z79899 Other long term (current) drug therapy: Secondary | ICD-10-CM | POA: Diagnosis not present

## 2023-07-18 DIAGNOSIS — I10 Essential (primary) hypertension: Secondary | ICD-10-CM | POA: Diagnosis not present

## 2023-07-18 DIAGNOSIS — I480 Paroxysmal atrial fibrillation: Secondary | ICD-10-CM | POA: Diagnosis not present

## 2023-07-18 LAB — LIPID PANEL

## 2023-07-18 NOTE — Progress Notes (Signed)
 Cardiology Office Note:  .   Date:  07/18/2023  ID:  Stacey Caldwell, DOB 1950/11/11, MRN [id number] PCP: Barbie Banner, MD  [facility] Providers Cardiologist:  Donato Schultz, MD     History of Present Illness: .   Stacey Caldwell is a 73 y.o. female Discussed the use of AI scribe software for clinical note transcription with the patient, who gave verbal consent to proceed.  History of Present Illness Stacey Caldwell is a 73 year old female with mild aortic dilatation, central hypertension, and paroxysmal atrial fibrillation who presents for follow-up.  She has been experiencing episodes of atrial fibrillation, with her heart rate previously recorded in the forties and fifties. Her Toprol XL was reduced to 12.5 mg to address lightheadedness, which improved her symptoms. Currently, her heart rate remains stable in the fifties.  She is on amiodarone 200 mg daily and Xarelto 20 mg daily. Despite this, she experienced a recurrence of atrial fibrillation, which she noticed this morning when her heart rate was 55. She does not typically feel the atrial fibrillation, except occasionally when lying flat at night. She uses a pulse oximeter to monitor her heart rate, which was 55 this morning.  Her past workup includes a portogram from 2023 showing an ejection fraction of 65% with a severely dilated left atrium. She has a history of sinus bradycardia with a heart rate of 46 last year. She has not missed any doses of her blood thinning medication, Xarelto.  She mentions that her long-term doctor, Dr. Andrey Campanile, retired, and she has had to find a new doctor. She requests that her cholesterol be checked during any upcoming blood work, as it was not included in previous tests.       Studies Reviewed: Marland Kitchen   EKG Interpretation Date/Time:  Monday July 18 2023 16:44:24 EDT Ventricular Rate:  59 PR Interval:  188 QRS Duration:  90 QT Interval:  464 QTC Calculation: 459 R Axis:   1  Text  Interpretation: Sinus bradycardia Inferior infarct (cited on or before 18-Jul-2023) When compared with ECG of 18-Jul-2023 16:25, Sinus rhythm has replaced Atrial fibrillation Vent. rate has decreased BY  71 BPM Confirmed by Donato Schultz ([id number]) on 07/18/2023 4:50:17 PM    Results LABS Hemoglobin: 12.9 Creatinine: 0.7  RADIOLOGY Portogram: EF 65%, severely dilated left atrium (2023)  DIAGNOSTIC EKG: Atrial fibrillation (07/18/2023) EKG: Sinus bradycardia, rate 59 bpm (07/18/2023) Risk Assessment/Calculations:            Physical Exam:   VS:  BP 126/74   Pulse (!) 115   Ht 5\' 1"  (1.549 m)   Wt 179 lb 12.8 oz (81.6 kg)   LMP 09/11/2002   SpO2 95%   BMI 33.97 kg/m    Wt Readings from Last 3 Encounters:  07/18/23 179 lb 12.8 oz (81.6 kg)  01/17/23 181 lb (82.1 kg)  12/14/22 183 lb (83 kg)    GEN: Well nourished, well developed in no acute distress NECK: No JVD; No carotid bruits CARDIAC: RRR, no murmurs, no rubs, no gallops RESPIRATORY:  Clear to auscultation without rales, wheezing or rhonchi  ABDOMEN: Soft, non-tender, non-distended EXTREMITIES:  No edema; No deformity   ASSESSMENT AND PLAN: .    Assessment and Plan Assessment & Plan Paroxysmal Atrial Fibrillation She is currently on Xarelto and amiodarone. During the visit, she was in atrial fibrillation but converted back to sinus bradycardia. Ablation therapy with an electrophysiologist was discussed to potentially minimize or stop atrial fibrillation  and reduce reliance on amiodarone. The procedure involves using a catheter to silence electrical activity in the atria, which may help isolate or minimize atrial fibrillation. Her dilated atria may reduce the success rate of the procedure. The electrophysiologist will evaluate the appropriateness of ablation or consider alternative treatments. - Take an additional 200 mg of amiodarone tonight - Hydrate well - Order repeat EKG to confirm rhythm - Refer to electrophysiology  for evaluation of potential ablation therapy - Check blood work including basic metabolic profile, CBC, and lipid panel  Hypertension She has central hypertension, which is well-managed. Her blood pressure was stable at home, with no acute issues during the visit.  Mild Aortic Dilatation She has mild aortic dilatation. There was no change in management during the visit.  Follow-up She requires follow-up appointments to monitor her conditions and the effectiveness of interventions. - Follow up with Alden Server in 6 months - Follow up with the doctor in 1 year  40 min spent with patient, review of chart, discussion of referral to EP         Signed, Donato Schultz, MD

## 2023-07-18 NOTE — Patient Instructions (Signed)
 Medication Instructions:  The current medical regimen is effective;  continue present plan and medications.  *If you need a refill on your cardiac medications before your next appointment, please call your pharmacy*  Lab Work: Please have blood work at [employer] [employer] (CBC, BMP, Lipid)  If you have labs (blood work) drawn today and your tests are completely normal, you will receive your results only by: MyChart Message (if you have MyChart) OR A paper copy in the mail If you have any lab test that is abnormal or we need to change your treatment, we will call you to review the results.  You have been referred to electrophysiology to discuss ablation procedure.  Follow-Up: At [facility], you and your health needs are our priority.  As part of our continuing mission to provide you with exceptional heart care, our providers are all part of one team.  This team includes your primary Cardiologist (physician) and Advanced Practice Providers or APPs (Physician Assistants and Nurse Practitioners) who all work together to provide you with the care you need, when you need it.  Your next appointment:   6 month(s)  Provider:   Robin Searing, NP      Then, Donato Schultz, MD will plan to see you again in 1 year(s).    We recommend signing up for the patient portal called "MyChart".  Sign up information is provided on this After Visit Summary.  MyChart is used to connect with patients for Virtual Visits (Telemedicine).  Patients are able to view lab/test results, encounter notes, upcoming appointments, etc.  Non-urgent messages can be sent to your provider as well.   To learn more about what you can do with MyChart, go to [email or web address].        1st Floor: - Lobby - Registration  - Pharmacy  - Lab - Cafe  2nd Floor: - PV Lab - Diagnostic Testing (echo, CT, nuclear med)  3rd Floor: - Vacant  4th Floor: - TCTS (cardiothoracic surgery) - AFib Clinic - Structural Heart Clinic -  Vascular Surgery  - Vascular Ultrasound  5th Floor: - HeartCare Cardiology (general and EP) - Clinical Pharmacy for coumadin, hypertension, lipid, weight-loss medications, and med management appointments    Valet parking services will be available as well.

## 2023-07-19 ENCOUNTER — Encounter: Payer: Self-pay | Admitting: Cardiology

## 2023-07-19 LAB — BASIC METABOLIC PANEL WITH GFR
BUN/Creatinine Ratio: 14 (ref 12–28)
BUN: 10 mg/dL (ref 8–27)
CO2: 17 mmol/L — ABNORMAL LOW (ref 20–29)
Calcium: 9.6 mg/dL (ref 8.7–10.3)
Chloride: 103 mmol/L (ref 96–106)
Creatinine, Ser: 0.72 mg/dL (ref 0.57–1.00)
Glucose: 96 mg/dL (ref 70–99)
Potassium: 4.1 mmol/L (ref 3.5–5.2)
Sodium: 141 mmol/L (ref 134–144)
eGFR: 88 mL/min/{1.73_m2} (ref >59)

## 2023-07-19 LAB — CBC
Hematocrit: 45.5 % (ref 34.0–46.6)
Hemoglobin: 15.5 g/dL (ref 11.1–15.9)
MCH: 32.2 pg (ref 26.6–33.0)
MCHC: 34.1 g/dL (ref 31.5–35.7)
MCV: 95 fL (ref 79–97)
Platelets: 274 10*3/uL (ref 150–450)
RBC: 4.81 x10E6/uL (ref 3.77–5.28)
RDW: 13.4 % (ref 11.7–15.4)
WBC: 8.1 10*3/uL (ref 3.4–10.8)

## 2023-07-19 LAB — LIPID PANEL
Cholesterol, Total: 258 mg/dL — ABNORMAL HIGH (ref 100–199)
HDL: 87 mg/dL (ref >39)
LDL CALC COMMENT:: 3 ratio (ref 0.0–4.4)
LDL Chol Calc (NIH): 148 mg/dL — ABNORMAL HIGH (ref 0–99)
Triglycerides: 131 mg/dL (ref 0–149)
VLDL Cholesterol Cal: 23 mg/dL (ref 5–40)

## 2023-07-26 ENCOUNTER — Ambulatory Visit: Attending: Cardiology | Admitting: Cardiology

## 2023-07-26 ENCOUNTER — Encounter: Payer: Self-pay | Admitting: Cardiology

## 2023-07-26 ENCOUNTER — Encounter: Payer: Self-pay | Admitting: *Deleted

## 2023-07-26 VITALS — BP 112/62 | HR 51 | Ht 61.0 in | Wt 180.4 lb

## 2023-07-26 DIAGNOSIS — I480 Paroxysmal atrial fibrillation: Secondary | ICD-10-CM | POA: Diagnosis not present

## 2023-07-26 DIAGNOSIS — I10 Essential (primary) hypertension: Secondary | ICD-10-CM | POA: Diagnosis not present

## 2023-07-26 DIAGNOSIS — D6869 Other thrombophilia: Secondary | ICD-10-CM | POA: Diagnosis not present

## 2023-07-26 DIAGNOSIS — Z79899 Other long term (current) drug therapy: Secondary | ICD-10-CM

## 2023-07-26 NOTE — Patient Instructions (Signed)
 Medication Instructions:  Your physician recommends that you continue on your current medications as directed. Please refer to the Current Medication list given to you today.  *If you need a refill on your cardiac medications before your next appointment, please call your pharmacy*   Follow-Up: At [facility], you and your health needs are our priority.  As part of our continuing mission to provide you with exceptional heart care, we have created designated Provider Care Teams.  These Care Teams include your primary Cardiologist (physician) and Advanced Practice Providers (APPs -  Physician Assistants and Nurse Practitioners) who all work together to provide you with the care you need, when you need it.   Your next appointment:   Please call us  when you make a decision on Tikosyn vs. Ablation   Ablation Your physician has recommended that you have an ablation. Catheter ablation is a medical procedure used to treat some cardiac arrhythmias (irregular heartbeats). During catheter ablation, a long, thin, flexible tube is put into a blood vessel in your groin (upper thigh), or neck. This tube is called an ablation catheter. It is then guided to your heart through the blood vessel. Radio frequency waves destroy small areas of heart tissue where abnormal heartbeats may cause an arrhythmia to start. Please see the instruction sheet given to you today.   Tikosyn (Dofetilide) Hospital Admission   Prior to day of admission:  Check with drug insurance company for cost of drug to ensure affordability --- Dofetilide 500 mcg twice a day.  GoodRx is an option if insurance copay is unaffordable.    No Benadryl is allowed 3 days prior to admission.   Please ensure no missed doses of your anticoagulation (blood thinner) for 3 weeks prior to admission. If a dose is missed please notify our office immediately.   A pharmacist will review all your medications for potential interactions with Tikosyn.  If any medication changes are needed prior to admission we will be in touch with you.   If any new medications are started AFTER your admission date is set with [employer] [facility]. Please notify our office immediately so your medication list can be updated and reviewed by our pharmacist again.  On day of admission:  Tikosyn initiation requires a 3 night/4 day hospital stay with constant telemetry monitoring. You will have an EKG after each dose of Tikosyn as well as daily lab draws.   If the drug does not convert you to normal rhythm a cardioversion after the 4th dose of Tikosyn.   Afib Clinic office visit on the morning of admission is needed for preliminary labs/ekg.   Time of admission is dependent on bed availability in the hospital. In some instances, you will be sent home until bed is available. Rarely admission can be delayed to the following day if hospital census prevents available beds.   You may bring personal belongings/clothing with you to the hospital. Please leave your suitcase in the car until you arrive in admissions.   Questions please call our office at [phone]

## 2023-07-26 NOTE — Progress Notes (Signed)
 Electrophysiology Office Note:   Date:  07/26/2023  ID:  Stacey Caldwell, DOB 27-Jun-1950, MRN [id number]  Primary Cardiologist: Dorothye Gathers, MD Electrophysiologist: Ardeen Kohler, MD      History of Present Illness:   Stacey Caldwell is a 73 y.o. female with h/o mild aortic dilatation, hypertension, and paroxysmal atrial fibrillation who is being seen today for evaluation of her atrial fibrillation.  Discussed the use of AI scribe software for clinical note transcription with the patient, who gave verbal consent to proceed.  History of Present Illness Stacey Caldwell is a 73 year old female with atrial fibrillation who presents for discussion of ablation therapy.  She has a history of atrial fibrillation diagnosed seven to eight years ago and was started on amiodarone due to a significantly elevated heart rate and difficulty breathing. Despite this treatment, she continues to experience episodes of atrial fibrillation, particularly when anxious or excited, with her heart rate increasing to 115 bpm during a recent clinic visit. She regularly monitors her heart rate, which typically ranges from 45 to 65 bpm, and experiences occasional nighttime palpitations.  She has a history of asthma and previously experienced significant breathing difficulties, initially thought to be due to her atrial fibrillation. 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  Her current medication regimen includes amiodarone, which she has been on for several years. She undergoes regular blood tests every six months to monitor potential long-term side effects of amiodarone on the thyroid, liver, and lungs.   Review of systems complete and found to be negative unless listed in HPI.   EP Information / Studies Reviewed:    EKG is not ordered today. EKG from 07/18/23 reviewed which showed  AF.      Echo 12/18/21:  1. Left ventricular ejection fraction, by estimation, is 60 to 65%. Left  ventricular ejection fraction by PLAX is 61 %. The left ventricle has  normal function. The left ventricle has no regional wall motion  abnormalities. There is moderate asymmetric  left ventricular hypertrophy of the basal-septal segment. Left ventricular  diastolic parameters are consistent with Grade II diastolic dysfunction  (pseudonormalization).   2. Right ventricular systolic function is normal. The right ventricular  size is normal. There is normal pulmonary artery systolic pressure.   3. Left atrial size was severely dilated.   4. Right atrial size was mildly dilated.   5. The mitral valve is normal in structure. Trivial mitral valve  regurgitation. No evidence of mitral stenosis.   6. Tricuspid valve regurgitation is moderate.   7. The aortic valve is normal in structure. Aortic valve regurgitation is  not visualized. No aortic stenosis is present.   8. Aortic dilatation noted. There is mild dilatation of the ascending  aorta, measuring 41 mm.   9. The inferior vena cava is normal in size with greater than 50%  respiratory variability, suggesting right atrial pressure of 3 mmHg.     Risk Assessment/Calculations:    CHA2DS2-VASc Score = 5   This indicates a 7.2% annual risk of stroke. The patient's score is based upon: CHF History: 1 HTN History: 1 Diabetes History: 0 Stroke History: 0 Vascular Disease History: 1 Age Score: 1 Gender Score: 1             Physical Exam:   VS:  BP 112/62   Pulse (!) 51   Ht 5'  1" (1.549 m)   Wt 180 lb 6.4 oz (81.8 kg)   LMP 09/11/2002   SpO2 99%   BMI 34.09 kg/m    Wt Readings from Last 3 Encounters:  07/26/23 180 lb 6.4 oz (81.8 kg)  07/18/23 179 lb 12.8 oz (81.6 kg)  01/17/23 181 lb (82.1 kg)     GEN: Well nourished, well developed in no acute distress NECK: No JVD CARDIAC: Bradycardic, regular rhythm.  RESPIRATORY:   Clear to auscultation without rales, wheezing or rhonchi  ABDOMEN: Soft, non-distended EXTREMITIES:  No edema; No deformity   ASSESSMENT AND PLAN:    #. Paroxysmal atrial fibrillation, symptomatic: Continues to have episodes despite amiodarone.  #. High risk medication use: Anti-arrhythmic drug therapy - amiodarone. LFTs normal 01/2023. TSH normal 06/2022.  #. RBBB: No use of class Ic agents. -Ideally, would like to find alternative to amiodarone due to risk profile. She continues to have paroxysms of AF despite amio, although appear to be short. Unclear if any other AAD would be anymore successful. Discussed treatment options today for AF including antiarrhythmic drug therapy, such as changing to dofetilide, and ablation. Discussed risks, recovery and likelihood of success with each treatment strategy. Risk, benefits, and alternatives to EP study and ablation for afib were discussed. These risks include but are not limited to stroke, bleeding, vascular damage, tamponade, perforation, damage to the esophagus, lungs, phrenic nerve and other structures, pulmonary vein stenosis, worsening renal function, coronary vasospasm and death.  Discussed potential need for repeat ablation procedures and antiarrhythmic drugs after an initial ablation. The patient understands these risk and wishes to think about her options.  -Continue amiodarone 200mg  once daily for now. We will order repeat LFTs and TSH.  -Continue metoprolol XL 12.5mg  daily.  #. Secondary hypercoagulable state due to atrial fibrillation: CHADSVASC score of at least 4.  - Continue Xarelto 20mg  once daily.   #Hypertension -At goal today.  Recommend checking blood pressures 1-2 times per week at home and recording the values.  Recommend bringing these recordings to the primary care physician.   Signed, Ardeen Kohler, MD

## 2023-08-06 ENCOUNTER — Other Ambulatory Visit: Payer: Self-pay | Admitting: Nurse Practitioner

## 2023-08-22 ENCOUNTER — Telehealth: Payer: Self-pay

## 2023-08-22 DIAGNOSIS — I480 Paroxysmal atrial fibrillation: Secondary | ICD-10-CM

## 2023-08-22 NOTE — Telephone Encounter (Signed)
-----   Message from Ardeen Kohler sent at 07/26/2023  9:09 PM EDT ----- Regarding: Amiodarone  If patient wants to remain on amio then we need to order LFTs and TSH.   Thanks, [employer]

## 2023-08-22 NOTE — Telephone Encounter (Signed)
Left message for patient to call back.  Labs ordered. 

## 2023-12-24 ENCOUNTER — Other Ambulatory Visit: Payer: Self-pay | Admitting: Cardiology

## 2023-12-24 ENCOUNTER — Other Ambulatory Visit: Payer: Self-pay | Admitting: Nurse Practitioner

## 2023-12-24 DIAGNOSIS — I480 Paroxysmal atrial fibrillation: Secondary | ICD-10-CM

## 2023-12-26 NOTE — Telephone Encounter (Signed)
 Prescription refill request for Xarelto  received.  Indication: AF Last office visit: 4/25 [place] Kitty Weight: 81.8 kg Age: 73 Scr: 0.72 CrCl: 87

## 2024-01-31 ENCOUNTER — Ambulatory Visit: Admitting: Physician Assistant

## 2024-02-28 ENCOUNTER — Encounter: Payer: Self-pay | Admitting: Pulmonary Disease

## 2024-02-28 ENCOUNTER — Ambulatory Visit: Admitting: Pulmonary Disease

## 2024-02-28 VITALS — BP 116/58 | HR 60 | Ht 61.0 in | Wt 171.0 lb

## 2024-02-28 DIAGNOSIS — J4540 Moderate persistent asthma, uncomplicated: Secondary | ICD-10-CM

## 2024-02-28 MED ORDER — BUDESONIDE-FORMOTEROL FUMARATE 80-4.5 MCG/ACT IN AERO: 2.0000 | INHALATION_SPRAY | Freq: Two times a day (BID) | RESPIRATORY_TRACT | 4 refills | Status: AC

## 2024-02-28 NOTE — Progress Notes (Signed)
 Established Patient Pulmonology Office Visit   Subjective:  Patient ID: Stacey Caldwell, female    DOB: 10-28-1950  MRN: [id number]  CC:  Chief Complaint  Patient presents with   Medical Management of Chronic Issues    Pt states no new concerns - new Rx inh     Discussed the use of AI scribe software for clinical note transcription with the patient, who gave verbal consent to proceed.  History of Present Illness Stacey Caldwell is a 73 year old female with moderate persistent asthma who presents for a follow-up and medication refill.  She has been stable since her last visit and requests a refill of her Symbicort  inhaler. 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 She experiences no issues with seasonal changes or cooler temperatures. Her condition is significantly improved compared to two years ago.        ROS    Current Outpatient Medications:    albuterol  (VENTOLIN  HFA) 108 (90 Base) MCG/ACT inhaler, TAKE 2 PUFFS BY MOUTH EVERY 6 HOURS AS NEEDED FOR WHEEZE OR SHORTNESS OF BREATH, Disp: 18 each, Rfl: 2   amiodarone  (PACERONE ) 200 MG tablet, TAKE 1 TABLET BY MOUTH EVERY DAY, Disp: 90 tablet, Rfl: 1   ascorbic acid (VITAMIN C) 500 MG tablet, Take 1 tablet by mouth daily., Disp: , Rfl:    CALCIUM & MAGNESIUM CARBONATES PO, Take 2 capsules by mouth daily., Disp: , Rfl:    furosemide  (LASIX ) 40 MG tablet, TAKE 1 TABLET BY MOUTH EVERY DAY, Disp: 90 tablet, Rfl: 1   Melatonin 5 MG TABS, Take 5 mg by mouth every evening., Disp: , Rfl:    metoprolol  succinate (TOPROL -XL) 25 MG 24 hr tablet, TAKE 1 TABLET (25 MG TOTAL) BY MOUTH DAILY., Disp: 90 tablet, Rfl: 3   NUTRITIONAL SUPPLEMENTS PO, Take 1 Dose by mouth daily. Serpeptase Enzymes, Disp: , Rfl:    saccharomyces boulardii (FLORASTOR) 250 MG capsule, Take by mouth., Disp: , Rfl:    valsartan  (DIOVAN ) 160 MG tablet, TAKE 1 TABLET BY  MOUTH EVERY DAY, Disp: 90 tablet, Rfl: 2   venlafaxine XR (EFFEXOR-XR) 37.5 MG 24 hr capsule, Take 1 capsule by mouth as directed., Disp: , Rfl:    XARELTO  20 MG TABS tablet, TAKE 1 TABLET BY MOUTH DAILY WITH SUPPER, Disp: 90 tablet, Rfl: 1   Zinc Gluconate 100 MG TABS, Take by mouth., Disp: , Rfl:    budesonide -formoterol  (SYMBICORT ) 80-4.5 MCG/ACT inhaler, Inhale 2 puffs into the lungs 2 (two) times daily. in the morning and at bedtime., Disp: 30.6 each, Rfl: 4      Objective:  BP (!) 116/58   Pulse 60   Ht 5' 1 (1.549 m) Comment: per pt  Wt 171 lb (77.6 kg)   LMP 09/11/2002   SpO2 95%   BMI 32.31 kg/m     Physical Exam Constitutional:      General: She is not in acute distress.    Appearance: Normal appearance. She is obese.  Eyes:     General: No scleral icterus.    Conjunctiva/sclera: Conjunctivae normal.  Cardiovascular:     Rate and Rhythm: Normal rate and regular rhythm.  Pulmonary:     Breath sounds: No wheezing, rhonchi or rales.  Musculoskeletal:     Right lower leg: No edema.     Left lower leg: No edema.  Skin:    General:  Skin is warm and dry.  Neurological:     General: No focal deficit present.      Diagnostic Review:       Assessment & Plan:   Assessment & Plan Moderate persistent asthma without complication  Orders:   budesonide -formoterol  (SYMBICORT ) 80-4.5 MCG/ACT inhaler; Inhale 2 puffs into the lungs 2 (two) times daily. in the morning and at bedtime.   Assessment and Plan Assessment & Plan Moderate persistent asthma Well-controlled with Symbicort . No recent rescue inhaler use post-laryngeal web surgery. - Continue Symbicort  80 mcg, two puffs twice daily. - Provided 65-month refill of Symbicort . - Scheduled follow-up in one year.      Return in about 1 year (around 02/27/2025) for f/u visit Dr. Kara.   Dorn KATHEE Kara, MD

## 2024-02-28 NOTE — Patient Instructions (Signed)
 Continue using symbicort  80-4.5mcg 2 puffs twice daily - rinse mouth out after each use  Follow up in 1 year, call sooner if needed

## 2024-02-28 NOTE — Assessment & Plan Note (Addendum)
  Orders:   budesonide -formoterol  (SYMBICORT ) 80-4.5 MCG/ACT inhaler; Inhale 2 puffs into the lungs 2 (two) times daily. in the morning and at bedtime.

## 2024-03-05 ENCOUNTER — Encounter: Payer: Self-pay | Admitting: *Deleted

## 2024-03-06 ENCOUNTER — Ambulatory Visit: Attending: Emergency Medicine | Admitting: Emergency Medicine

## 2024-03-06 ENCOUNTER — Encounter: Payer: Self-pay | Admitting: Emergency Medicine

## 2024-03-06 VITALS — BP 116/66 | HR 114 | Ht 61.0 in | Wt 170.0 lb

## 2024-03-06 DIAGNOSIS — I480 Paroxysmal atrial fibrillation: Secondary | ICD-10-CM | POA: Diagnosis not present

## 2024-03-06 NOTE — Progress Notes (Addendum)
 Cardiology Office Note:    Date:  03/06/2024  ID:  Stacey Caldwell, DOB May 05, 1950, MRN [id number] PCP: Stacey Caldwell DEL, MD  [facility] Providers Cardiologist:  Oneil Parchment, MD Electrophysiologist:  Fonda Kitty, MD       Patient Profile:       Chief Complaint: 6 part follow-up History of Present Illness:  Stacey Caldwell is a 73 y.o. female with visit-pertinent history of hypertension, mild aortic dilation, HFrEF, paroxysmal atrial fibrillation, GERD  She established care with our group on 07/19/16 with Dr. Varasnasi for a fib. She was referred to Dr. Rosamond at [facility] and was started on Xarelto . Echo revealed LVEF 35-40%. She was told she was in heart failure and needed a cardioversion, thought to have NICM. She wanted another opinion and so came for an evaluation with our group. Symptom was DOE, no palpitations. Cardiomyopathy improved on follow-up echo 10/2016 to LVEF 60-65%   Echocardiogram 12/2021 showed LVEF 60 to 65%, no RWMA, moderate asymmetric LVH of the basal-septal segment, grade 2 DD, RV function and size normal, left atrial size is severely dilated, right atrial size is mildly dilated, trivial MR, moderate TR, mild dilation of ascending aorta measuring 41 mm.  Seen on 12/14/2022 by Jackee, NP.  She was experiencing lightheadedness and metoprolol  was reduced to 12.5 mg daily.  She was last seen by general cardiology on 07/18/2023 by Dr. Parchment.  She noted to be experiencing episodes of atrial fibrillation with her heart rate previously recorded in the 40s and 50s. She was on amiodarone  and Xarelto  and despite this she experienced a recurrence of atrial fibrillation.  During the visit she was actually in atrial fibrillation but converted back to sinus bradycardia.  She was to follow-up with electrophysiology.  She was last seen by Dr. Kitty over EP on 07/26/2023.  Ablation was discussed with the patient wishes to think about her options.  She was continued on  amiodarone  200 mg once daily and metoprolol  Excel 12.5 mg daily.   Discussed the use of AI scribe software for clinical note transcription with the patient, who gave verbal consent to proceed.  History of Present Illness Stacey Caldwell is a 72 year old female with atrial fibrillation who presents for a routine six-month follow-up.  Today she is doing well without acute concerns.  She tells me that she declined an ablation due to concerns about efficacy and complications.  Her home heart rate is usually 45 to 55 beats per minute. She does not have palpitations or a racing heart and monitors her heart rate regularly. She takes amiodarone  daily and Xarelto  for stroke prevention related to atrial fibrillation.  She has asthma with exertional shortness of breath, but there is no new or worsening dyspnea.  She has no chest pain, orthopnea, PND, LEE, syncope, presyncope, lightheadedness, dizziness or palpitations and no new cardiopulmonary symptoms.   Review of systems:  Please see the history of present illness. All other systems are reviewed and otherwise negative.      Studies Reviewed:        Echocardiogram 12/18/2021  1. Left ventricular ejection fraction, by estimation, is 60 to 65%. Left  ventricular ejection fraction by PLAX is 61 %. The left ventricle has  normal function. The left ventricle has no regional wall motion  abnormalities. There is moderate asymmetric  left ventricular hypertrophy of the basal-septal segment. Left ventricular  diastolic parameters are consistent with Grade II diastolic dysfunction  (pseudonormalization).   2. Right  ventricular systolic function is normal. The right ventricular  size is normal. There is normal pulmonary artery systolic pressure.   3. Left atrial size was severely dilated.   4. Right atrial size was mildly dilated.   5. The mitral valve is normal in structure. Trivial mitral valve  regurgitation. No evidence of mitral stenosis.   6.  Tricuspid valve regurgitation is moderate.   7. The aortic valve is normal in structure. Aortic valve regurgitation is  not visualized. No aortic stenosis is present.   8. Aortic dilatation noted. There is mild dilatation of the ascending  aorta, measuring 41 mm.   9. The inferior vena cava is normal in size with greater than 50%  respiratory variability, suggesting right atrial pressure of 3 mmHg.   Risk Assessment/Calculations:    CHA2DS2-VASc Score =     This indicates a  % annual risk of stroke. The patient's score is based upon:              Physical Exam:   VS:  BP 116/66 (BP Location: Right Arm, Patient Position: Sitting, Cuff Size: Normal)   Pulse (!) 114   Ht 5' 1 (1.549 m)   Wt 170 lb (77.1 kg)   LMP 09/11/2002   BMI 32.12 kg/m    Wt Readings from Last 3 Encounters:  03/06/24 170 lb (77.1 kg)  02/28/24 171 lb (77.6 kg)  07/26/23 180 lb 6.4 oz (81.8 kg)    GEN: Well nourished, well developed in no acute distress NECK: No JVD; No carotid bruits CARDIAC: Irregularly irregular rhythm. no murmurs, rubs, gallops RESPIRATORY:  Clear to auscultation without rales, wheezing or rhonchi  ABDOMEN: Soft, non-tender, non-distended EXTREMITIES:  No edema; No acute deformity      Assessment and Plan:  Persistent atrial fibrillation EKG today shows she is in atrial fibrillation with rate of 114 Tells me she monitors her heart rate 3 times a day with average heart rate between 45 and 55 bpm.  She does seem upset today which could cause her increased heart rate - With history of bradycardia will not increase her metoprolol  at this time - Fortunately she is cardiac unaware of her arrhythmia and is without complaints of tachycardia or palpitations - She is not interested in cardioversion.  Unsure how effective a cardioversion would be as unknown amount of time she has been in persistent AF.  Her atria were severely dilated on echo in 2023 - Seems she has likely failed amiodarone   therapy.  I will send her to follow back up with to Dr. Kennyth with EP to discuss ablation or other AAD options - Plan for rate control therapy at this time - Continue amiodarone  200 mg daily - Continue metoprolol  succinate 25 mg daily - Continue Xarelto  20 mg daily - BMET, LFTs, TSH - She will alert office for persistent heart rates over >100  Hypertension Blood pressure today is well-controlled at 116/66 - Continue valsartan  160 mg daily  Mild aortic dilation Echocardiogram 12/2021 by dilation of ascending aorta measuring 41 mm - Stable.  Blood pressure well-controlled.  No acute interventions warranted at this time - She will need a CTA aorta at follow-up visit      Dispo: Follow-up with Dr. Kennyth at next available.  Follow-up with Dr. Jeffrie in 6 months.  Signed, Lum LITTIE Louis, NP

## 2024-03-06 NOTE — Patient Instructions (Addendum)
 Medication Instructions:  NO CHANGES  Lab Work: TSH AND CMET TO BE DONE TODAY.  Testing/Procedures: NONE  Follow-Up: At [facility], you and your health needs are our priority.  As part of our continuing mission to provide you with exceptional heart care, our providers are all part of one team.  This team includes your primary Cardiologist (physician) and Advanced Practice Providers or APPs (Physician Assistants and Nurse Practitioners) who all work together to provide you with the care you need, when you need it.  Your next appointment:   NEXT AVAILABLE WITH FONDA KITTY, MD 6 MONTHS WITH DR. ONEIL PARCHMENT, MD  Provider:   Oneil Parchment, MD

## 2024-03-07 ENCOUNTER — Ambulatory Visit: Payer: Self-pay | Admitting: Emergency Medicine

## 2024-03-07 LAB — COMPREHENSIVE METABOLIC PANEL WITH GFR
ALT: 8 IU/L (ref 0–32)
AST: 16 IU/L (ref 0–40)
Albumin: 4.3 g/dL (ref 3.8–4.8)
Alkaline Phosphatase: 77 IU/L (ref 49–135)
BUN/Creatinine Ratio: 14 (ref 12–28)
BUN: 11 mg/dL (ref 8–27)
Bilirubin Total: 0.5 mg/dL (ref 0.0–1.2)
CO2: 17 mmol/L — ABNORMAL LOW (ref 20–29)
Calcium: 9.1 mg/dL (ref 8.7–10.3)
Chloride: 104 mmol/L (ref 96–106)
Creatinine, Ser: 0.76 mg/dL (ref 0.57–1.00)
Globulin, Total: 2.2 g/dL (ref 1.5–4.5)
Glucose: 92 mg/dL (ref 70–99)
Potassium: 4 mmol/L (ref 3.5–5.2)
Sodium: 139 mmol/L (ref 134–144)
Total Protein: 6.5 g/dL (ref 6.0–8.5)
eGFR: 82 mL/min/1.73 (ref >59)

## 2024-03-07 LAB — TSH: TSH: 1.93 u[IU]/mL (ref 0.450–4.500)

## 2024-03-21 ENCOUNTER — Other Ambulatory Visit: Payer: Self-pay | Admitting: Nurse Practitioner

## 2024-03-21 DIAGNOSIS — I119 Hypertensive heart disease without heart failure: Secondary | ICD-10-CM

## 2024-05-01 ENCOUNTER — Ambulatory Visit: Admitting: Cardiology
